# Patient Record
Sex: Female | Born: 1955
Health system: Southern US, Community
[De-identification: ages and names within clinical notes are randomized; demographics above are authoritative.]

## PROBLEM LIST (undated history)

## (undated) DIAGNOSIS — S060X9A Concussion with loss of consciousness of unspecified duration, initial encounter: Secondary | ICD-10-CM

## (undated) DIAGNOSIS — N952 Postmenopausal atrophic vaginitis: Secondary | ICD-10-CM

## (undated) DIAGNOSIS — Z9889 Other specified postprocedural states: Secondary | ICD-10-CM

## (undated) DIAGNOSIS — T4145XA Adverse effect of unspecified anesthetic, initial encounter: Secondary | ICD-10-CM

## (undated) DIAGNOSIS — T8859XA Other complications of anesthesia, initial encounter: Secondary | ICD-10-CM

## (undated) DIAGNOSIS — G4700 Insomnia, unspecified: Secondary | ICD-10-CM

## (undated) DIAGNOSIS — IMO0002 Reserved for concepts with insufficient information to code with codable children: Secondary | ICD-10-CM

## (undated) DIAGNOSIS — R51 Headache: Secondary | ICD-10-CM

## (undated) DIAGNOSIS — R112 Nausea with vomiting, unspecified: Secondary | ICD-10-CM

## (undated) DIAGNOSIS — I10 Essential (primary) hypertension: Secondary | ICD-10-CM

## (undated) DIAGNOSIS — L409 Psoriasis, unspecified: Secondary | ICD-10-CM

## (undated) DIAGNOSIS — E7800 Pure hypercholesterolemia, unspecified: Secondary | ICD-10-CM

## (undated) DIAGNOSIS — G479 Sleep disorder, unspecified: Secondary | ICD-10-CM

## (undated) DIAGNOSIS — M810 Age-related osteoporosis without current pathological fracture: Secondary | ICD-10-CM

## (undated) DIAGNOSIS — Z973 Presence of spectacles and contact lenses: Secondary | ICD-10-CM

## (undated) DIAGNOSIS — N950 Postmenopausal bleeding: Secondary | ICD-10-CM

## (undated) DIAGNOSIS — R233 Spontaneous ecchymoses: Secondary | ICD-10-CM

## (undated) DIAGNOSIS — R238 Other skin changes: Secondary | ICD-10-CM

## (undated) DIAGNOSIS — Z8249 Family history of ischemic heart disease and other diseases of the circulatory system: Secondary | ICD-10-CM

## (undated) DIAGNOSIS — M1990 Unspecified osteoarthritis, unspecified site: Secondary | ICD-10-CM

## (undated) DIAGNOSIS — W19XXXA Unspecified fall, initial encounter: Secondary | ICD-10-CM

## (undated) DIAGNOSIS — Z85828 Personal history of other malignant neoplasm of skin: Secondary | ICD-10-CM

## (undated) DIAGNOSIS — R519 Headache, unspecified: Secondary | ICD-10-CM

## (undated) DIAGNOSIS — Z87440 Personal history of urinary (tract) infections: Secondary | ICD-10-CM

## (undated) DIAGNOSIS — F419 Anxiety disorder, unspecified: Secondary | ICD-10-CM

## (undated) DIAGNOSIS — L4050 Arthropathic psoriasis, unspecified: Secondary | ICD-10-CM

## (undated) DIAGNOSIS — M545 Low back pain, unspecified: Secondary | ICD-10-CM

## (undated) DIAGNOSIS — I517 Cardiomegaly: Secondary | ICD-10-CM

## (undated) DIAGNOSIS — S060XAA Concussion with loss of consciousness status unknown, initial encounter: Secondary | ICD-10-CM

## (undated) DIAGNOSIS — Z8709 Personal history of other diseases of the respiratory system: Secondary | ICD-10-CM

## (undated) HISTORY — DX: Family history of ischemic heart disease and other diseases of the circulatory system: Z82.49

## (undated) HISTORY — PX: HAND SURGERY: SHX662

## (undated) HISTORY — DX: Pure hypercholesterolemia, unspecified: E78.00

## (undated) HISTORY — DX: Insomnia, unspecified: G47.00

## (undated) HISTORY — DX: Headache, unspecified: R51.9

## (undated) HISTORY — DX: Postmenopausal bleeding: N95.0

## (undated) HISTORY — DX: Postmenopausal atrophic vaginitis: N95.2

## (undated) HISTORY — DX: Anxiety disorder, unspecified: F41.9

## (undated) HISTORY — DX: Low back pain, unspecified: M54.50

## (undated) HISTORY — DX: Headache: R51

---

## 2000-03-21 ENCOUNTER — Ambulatory Visit (HOSPITAL_BASED_OUTPATIENT_CLINIC_OR_DEPARTMENT_OTHER): Admission: RE | Admit: 2000-03-21 | Discharge: 2000-03-21 | Payer: Self-pay | Admitting: *Deleted

## 2000-06-13 ENCOUNTER — Ambulatory Visit (HOSPITAL_BASED_OUTPATIENT_CLINIC_OR_DEPARTMENT_OTHER): Admission: RE | Admit: 2000-06-13 | Discharge: 2000-06-13 | Payer: Self-pay | Admitting: *Deleted

## 2000-10-30 ENCOUNTER — Other Ambulatory Visit: Admission: RE | Admit: 2000-10-30 | Discharge: 2000-10-30 | Payer: Self-pay | Admitting: Obstetrics & Gynecology

## 2001-05-21 ENCOUNTER — Encounter (INDEPENDENT_AMBULATORY_CARE_PROVIDER_SITE_OTHER): Payer: Self-pay | Admitting: Specialist

## 2001-05-21 ENCOUNTER — Encounter: Payer: Self-pay | Admitting: Rheumatology

## 2001-05-21 ENCOUNTER — Ambulatory Visit (HOSPITAL_COMMUNITY): Admission: RE | Admit: 2001-05-21 | Discharge: 2001-05-21 | Payer: Self-pay | Admitting: Rheumatology

## 2001-10-31 ENCOUNTER — Other Ambulatory Visit: Admission: RE | Admit: 2001-10-31 | Discharge: 2001-10-31 | Payer: Self-pay | Admitting: Obstetrics & Gynecology

## 2002-01-14 ENCOUNTER — Encounter: Admission: RE | Admit: 2002-01-14 | Discharge: 2002-01-14 | Payer: Self-pay | Admitting: Rheumatology

## 2002-01-14 ENCOUNTER — Encounter: Payer: Self-pay | Admitting: Rheumatology

## 2002-11-26 ENCOUNTER — Other Ambulatory Visit: Admission: RE | Admit: 2002-11-26 | Discharge: 2002-11-26 | Payer: Self-pay | Admitting: Obstetrics & Gynecology

## 2003-09-04 ENCOUNTER — Encounter (INDEPENDENT_AMBULATORY_CARE_PROVIDER_SITE_OTHER): Payer: Self-pay | Admitting: Specialist

## 2003-09-04 ENCOUNTER — Ambulatory Visit (HOSPITAL_COMMUNITY): Admission: RE | Admit: 2003-09-04 | Discharge: 2003-09-04 | Payer: Self-pay | Admitting: Rheumatology

## 2004-01-04 ENCOUNTER — Other Ambulatory Visit: Admission: RE | Admit: 2004-01-04 | Discharge: 2004-01-04 | Payer: Self-pay | Admitting: Obstetrics & Gynecology

## 2004-10-16 HISTORY — PX: BACK SURGERY: SHX140

## 2005-02-02 ENCOUNTER — Other Ambulatory Visit: Admission: RE | Admit: 2005-02-02 | Discharge: 2005-02-02 | Payer: Self-pay | Admitting: Obstetrics & Gynecology

## 2005-06-10 ENCOUNTER — Encounter: Admission: RE | Admit: 2005-06-10 | Discharge: 2005-06-10 | Payer: Self-pay | Admitting: Neurological Surgery

## 2005-06-29 ENCOUNTER — Inpatient Hospital Stay (HOSPITAL_COMMUNITY): Admission: RE | Admit: 2005-06-29 | Discharge: 2005-06-30 | Payer: Self-pay | Admitting: Neurological Surgery

## 2005-08-08 ENCOUNTER — Encounter: Admission: RE | Admit: 2005-08-08 | Discharge: 2005-08-08 | Payer: Self-pay | Admitting: Neurological Surgery

## 2005-09-25 ENCOUNTER — Encounter: Admission: RE | Admit: 2005-09-25 | Discharge: 2005-09-25 | Payer: Self-pay | Admitting: Neurological Surgery

## 2005-10-24 ENCOUNTER — Encounter: Admission: RE | Admit: 2005-10-24 | Discharge: 2005-10-24 | Payer: Self-pay | Admitting: Neurological Surgery

## 2006-03-26 ENCOUNTER — Encounter: Admission: RE | Admit: 2006-03-26 | Discharge: 2006-03-26 | Payer: Self-pay | Admitting: Neurological Surgery

## 2006-03-29 ENCOUNTER — Encounter: Admission: RE | Admit: 2006-03-29 | Discharge: 2006-03-29 | Payer: Self-pay | Admitting: Neurological Surgery

## 2007-01-14 ENCOUNTER — Encounter: Admission: RE | Admit: 2007-01-14 | Discharge: 2007-01-14 | Payer: Self-pay | Admitting: Anesthesiology

## 2007-09-16 ENCOUNTER — Encounter: Admission: RE | Admit: 2007-09-16 | Discharge: 2007-09-16 | Payer: Self-pay | Admitting: Neurological Surgery

## 2007-09-24 ENCOUNTER — Encounter: Admission: RE | Admit: 2007-09-24 | Discharge: 2007-09-24 | Payer: Self-pay | Admitting: Neurological Surgery

## 2007-12-07 ENCOUNTER — Encounter: Admission: RE | Admit: 2007-12-07 | Discharge: 2007-12-07 | Payer: Self-pay | Admitting: Neurological Surgery

## 2008-05-25 ENCOUNTER — Encounter: Admission: RE | Admit: 2008-05-25 | Discharge: 2008-05-25 | Payer: Self-pay | Admitting: Rheumatology

## 2009-03-01 ENCOUNTER — Encounter: Admission: RE | Admit: 2009-03-01 | Discharge: 2009-03-01 | Payer: Self-pay | Admitting: Rheumatology

## 2010-05-13 ENCOUNTER — Encounter: Admission: RE | Admit: 2010-05-13 | Discharge: 2010-05-13 | Payer: Self-pay | Admitting: Neurological Surgery

## 2010-11-06 ENCOUNTER — Encounter: Payer: Self-pay | Admitting: Neurological Surgery

## 2010-12-27 ENCOUNTER — Other Ambulatory Visit: Payer: Self-pay | Admitting: Obstetrics & Gynecology

## 2011-03-03 NOTE — Op Note (Signed)
[place]  Patient:    [name], [name]                      MRN: [id number] Proc. Date: 06/13/00 Adm. Date:  [id number] Attending:  Kendell Bane CC:         Demetria Pore. Coral Spikes, M.D.                           Operative Report  PREOPERATIVE DIAGNOSIS:  Fibrous union of proximal interphalangeal arthrodesis, right index finger.  POSTOPERATIVE DIAGNOSIS:  Fibrous union of proximal interphalangeal arthrodesis, right index finger.  OPERATION PERFORMED:  Excision of fibrous tissue with arthrodesis of proximal interphalangeal joint, right index finger.  SURGEON:  Lowell Bouton, M.D.  ANESTHESIA:  Axillary block augmented by general.  OPERATIVE FINDINGS:  The patient had fibrosis over the ends of the bone, both on the proximal phalanx and middle phalanx side.  The fibrous tissue was preventing bony union.  DESCRIPTION OF PROCEDURE:  Under general anesthesia with a tourniquet on the right arm, the right hand was prepped and draped in the usual fashion.  After exsanguinating the limb, the tourniquet was inflated to  250 mmHg.  A longitudinal incision was made in line with the old scar on the dorsum of the right index finger proximal interphalangeal joint.  Sharp dissection was carried through the extensor mechanism which was elevated off of the bone radially and ulnarly.  The proximal interphalangeal joint was flexed and fibrous tissue was removed with a rongeur.  The ends of the bone were then fractured up with a saw and saline irrigation was used to cool the saw blade. After freshening up the end, the PIP joint was approximated and was found to be appropriately at about 30 degrees of flexion.  X-rays showed good position of the proposed fusion.  A drill hole was made with a 45 K-wire in the middle phalanx through the dorsal cortex radially and ulnarly for passage of the 26 gauge wire.  A 26 gauge wire was passed through the  hole distally for tension band wiring technique.  A free needle was used to pass the wire.  Two 45 K-wires were then placed across the PIP joint from proximal to distal.  The joint was compressed for arthrodesis while passing the wires.  Clinically, there was no rotational deformity.  The position looked good under x-ray.  The fusion surfaces were approximated well.  The figure-of-eight tension band wire was then passed around the K-wires proximally and was tightened with a needle holder.  The edge of the wire was buried underneath the tendon.  The K-wires were then bent over and were impacted into the bone.  The wound was irrigated copiously with saline. There did not appear to be room to apply any type of bone graft and so grafting was not performed.  The extensor mechanism was closed with 4-0 Mersilene and the skin with a 4-0 subcuticular Prolene. Steri-Strips were applied followed by sterile  dressings and a dorsal protective splint.  The patient tolerated the procedure well and went to the recovery room awake and stable in good condition. DD:  06/13/00 TD:  06/14/00 Job: [id number]

## 2011-03-03 NOTE — Op Note (Signed)
[name], [name]             ACCOUNT NO.:  [id number]   MEDICAL RECORD NO.:  [id number]          PATIENT TYPE:  INP   LOCATION:  2869                         FACILITY:  [facility]   PHYSICIAN:  Tia Alert, MD     DATE OF BIRTH:  1955/11/14   DATE OF PROCEDURE:  DATE OF DISCHARGE:                                 OPERATIVE REPORT   PREOPERATIVE DIAGNOSIS:  Cervical spondylosis with cervical spinal stenosis  and subaxial subluxation secondary to rheumatologic disease.   POSTOPERATIVE DIAGNOSIS:  Cervical spondylosis with cervical spinal stenosis  and subaxial subluxation secondary to rheumatologic disease.   PROCEDURES:  1.  Decompressive cervical laminectomy C3 to C6 with foraminotomies at C4-5      bilaterally.  2.  Posterior cervical arthrodesis C3 to C7 inclusive utilizing locally      harvested morselized autologous bone graft and BMP soaked sponges.  3.  Segmental fixation C3 to C7 utilizing the vertex lateral mass screw      fixation system.   SURGEON:  Marikay Alar M.D.   ASSISTANT:  Dr. Aliene Beams.   ANESTHESIA:  General endotracheal.   COMPLICATIONS:  None apparent.   INDICATIONS FOR PROCEDURE:  [name] is a 55 year old female with a  history of psoriatic arthritis with multiple joint abnormalities.  She  presented with neck pain and had plain films which showed subaxial  subluxation of C4 on C5, C5 on C6.  She had an MRI which showed severe  spinal stenosis at C4-5. I recommended a posterior cervical decompression  and instrumented fusion from C3 to C7. She understood the risks, benefits  and alternatives and wished to proceed.   DESCRIPTION OF PROCEDURE:  The patient was taken to operating room and after  induction of adequate generalized endotracheal anesthesia, her head was  placed a three-point Mayfield headrest and she was rolled into prone  position on chest rolls and her head was fixed in a neutral position. Her  posterior cervical region was  prepped with DuraPrep and draping in the usual  sterile fashion. A dorsal midline incision was made and carried down to the  cervical fascia which was opened and the paraspinous musculature was taken  down in subperiosteal fashion to expose C3 to C7 bilaterally. Intraoperative  fluoroscopy confirmed my level. She had a lot of degenerative changes and  dysplastic changes at C4 and C5 with diastasis of the facettes at C4-5. She  had severe subluxation preoperatively but upon positioning was reduced the  C4-5, suggesting significant instability at this level.  I localized the  lateral mass mid points at C3, C5, C6 and C7, decorticated 1 mm medial to  the mid point of lateral mass and then drilled in an upward and outward  direction under fluoroscopic guidance into the safe zone of the lateral  mass.  I then the tapped each one and then used 12 mm vertex screws and  placed these into the lateral masses at C3, C5, C6 and C7.  At C4, I tried  to start drilling into the safe zone, however, this was very difficult  because of the  dysplastic lamina.  It was quite thin and I was unable to  achieve good purchase into the bone. Therefore I left lateral mass screws  out of C4.  I then used the Kerrison punch to perform laminectomies at C3,  C4, C5 and C6 and the top of C7 and perform foraminotomies at C4-5.  There  was quite a bit of wasting of the dura at C4-5 with stenosis and then the  dura was pulsatile and well decompressed.  I then irrigated with copious  amounts of bacitracin containing saline solution and lined the dura with  Surgifoam and then shaped two lordotic rods and placed these into the  multiaxial screw heads of the vertex screws and locked these in position  with the locking caps and antitorque device and placed a cross link for  added stability.  I then drilled the lateral masses, I had already drilled  into the facette joint and I was able to packed this with local autograft  and BMP  soaked sponges lateral to the vertex screws.  I then placed a medium  Hemovac drain through a separate stab incision, layered the dura with  Gelfoam, and then closed the fascia with interrupted #1 Vicryl, closed the  subcutaneous and subcuticular tissues with 2-0 and 3-0 Vicryl, closed the  skin with Benzoin and Steri-Strips. The drapes were removed. The patient was  taken out of three-point Mayfield headrest, awakened from general anesthesia  and transferred to recovery room in stable condition.  At the end of the  procedure all sponge, needle and instrument counts were correct.      Tia Alert, MD  Electronically Signed     DSJ/MEDQ  D:  06/29/2005  T:  06/29/2005  Job:  [id number]

## 2011-03-03 NOTE — Op Note (Signed)
[place]  Patient:    [name], [name]                      MRN: [id number] Proc. Date: 03/21/00 Adm. Date:  [id number] Disc. Date: [id number] Attending:  Kendell Bane CC:         Demetria Pore. Coral Spikes, M.D.                           Operative Report  PREOPERATIVE DIAGNOSIS:  Psoriatic arthritis with instability and pain in right index finger.  POSTOPERATIVE DIAGNOSIS:  Psoriatic arthritis with instability and pain in right index finger.  PROCEDURE:  Arthrodesis posterior interphalangeal joint and distal interphalangeal joint, right index finger.  SURGEON:  Lowell Bouton, M.D.  ASSISTANT:  ANESTHESIA:  Axillary block.  OPERATIVE FINDINGS:  The patient had volar subluxation of the PIP joint with complete destruction of the articular surface on the proximal phalanx.  The end of the bone was pointed instead of round at the PIP joint.  The DIP joint had synovitis and destruction with total instability, however, the articular surfaces were not completely destroyed.  DESCRIPTION OF PROCEDURE:  Under axillary block anesthesia with the tourniquet on the right arm, the right hand was prepped and draped in the usual fashion and the patient was given IV Ancef preoperatively.  The limb was exsanguinated and the tourniquet was inflated to 250 mmHg.  A longitudinal incision was made in the midline over the dorsum of the PIP joint.  The extensor tendon was divided longitudinally and the tendon was elevated from its insertion on the middle phalanx radially and ulnarly.  There was a fair amount of synovitis at the PIP level and this was debrided with the rongeur.  The collateral ligaments were cut radially and ulnarly and the finger was flexed to expose the articular surface.  A rongeur was used to round off the pointed bone on the proximal phalanx.  The proximal end of the middle phalanx was prepared by using a 35 K-wire and making  drill holes circumferentially around the articular surface and an osteotome was used to remove the cartilage down to subchondral bone.  This formed a typical cup and cone arthrodesis.  Two 35 K-wires were then passed from the joint across the middle phalanx and were then passed retrograde back into the proximal phalanx with the joint in about 30 degrees of flexion.  Alignment looked good and the K-wires were bent over and left protruding from the skin.  At this point, the incision was extended down to the DIP joint and then a Y-shaped limb was placed distally. Dissection was carried down through the extensor tendon and a transverse arthrotomy was made at the DIP joint.  Synovial tissue was removed with the rongeur and the collateral ligaments were divided with a knife.  The joint surfaces were prepared similar to the PIP joint with the rongeur proximally making a round surface on the middle phalanx and K-wires and an osteotome making a cup surface on the distal phalanx.  A 35 K-wire was then placed longitudinally out through the distal phalanx, extending from the tip of the digit, and a second K-wire obliquely extending through the distal phalanx. The longitudinal K-wire was then passed retrograde across the DIP joint with the DIP joint in about 5 degrees of flexion.  The second K-wire was placed obliquely and x-rays showed good position of both joints.  Clinically  the patients finger appeared straighter and was kept in slight supination to allow for improvement and pinch.  The K-wires were bent over and left protruding from the skin.  The wounds were irrigated copiously with saline. The extensor mechanism was repaired with a 4-0 Vicryl and the skin with a 4-0 nylon.  Sterile dressings were applied.  The tourniquet was released with good circulation to the digit.  The patient was placed in a bulky bandage on the index finger using an Alumaform splint to protect the digit.  The remaining  digits were left free for her to use.  She tolerated the procedure well and went to the recovery room, awake, stable, and in good condition. DD:  03/21/00 TD:  03/24/00 Job: [id number]

## 2013-03-27 ENCOUNTER — Other Ambulatory Visit: Payer: Self-pay | Admitting: Dermatology

## 2013-06-03 ENCOUNTER — Other Ambulatory Visit: Payer: Self-pay | Admitting: Obstetrics & Gynecology

## 2013-10-23 ENCOUNTER — Encounter (HOSPITAL_COMMUNITY): Payer: Self-pay | Admitting: Pharmacy Technician

## 2013-10-24 ENCOUNTER — Other Ambulatory Visit (HOSPITAL_COMMUNITY): Payer: Self-pay | Admitting: *Deleted

## 2013-10-24 NOTE — Patient Instructions (Addendum)
THANDIWE SIRAGUSA  10/24/2013                           YOUR PROCEDURE IS SCHEDULED ON: 11/03/13               PLEASE REPORT TO SHORT STAY CENTER AT :  7:30 AM               CALL THIS NUMBER IF ANY PROBLEMS THE DAY OF SURGERY :               [phone]                      REMEMBER:   Do not eat food or drink liquids AFTER MIDNIGHT   Take these medicines the morning of surgery with A SIP OF WATER: PREDNISONE / MAY TAKE TYLENOL IF NEEDED   Do not wear jewelry, make-up   Do not wear lotions, powders, or perfumes.   Do not shave legs or underarms 12 hrs. before surgery (men may shave face)  Do not bring valuables to the hospital.  Contacts, dentures or bridgework may not be worn into surgery.  Leave suitcase in the car. After surgery it may be brought to your room.  For patients admitted to the hospital more than one night, checkout time is 11:00                          The day of discharge.   Patients discharged the day of surgery will not be allowed to drive home                             If going home same day of surgery, must have someone stay with you first                           24 hrs at home and arrange for some one to drive you home from hospital.    Special Instructions:   Please read over the following fact sheets that you were given:               1. MRSA  INFORMATION                      2. [place] PREPARING FOR SURGERY SHEET               3. INCENTIVE SPIROMETER                                                X_____________________________________________________________________        Failure to follow these instructions may result in cancellation of your surgery

## 2013-10-28 ENCOUNTER — Encounter (HOSPITAL_COMMUNITY)
Admission: RE | Admit: 2013-10-28 | Discharge: 2013-10-28 | Disposition: A | Discharge status: Home / Self Care | Payer: BC Managed Care – PPO | Source: Ambulatory Visit | Attending: Orthopedic Surgery | Admitting: Orthopedic Surgery

## 2013-10-28 ENCOUNTER — Other Ambulatory Visit: Payer: Self-pay

## 2013-10-28 ENCOUNTER — Encounter (HOSPITAL_COMMUNITY): Payer: Self-pay

## 2013-10-28 ENCOUNTER — Ambulatory Visit (HOSPITAL_COMMUNITY)
Admission: RE | Admit: 2013-10-28 | Discharge: 2013-10-28 | Disposition: A | Discharge status: Home / Self Care | Payer: BC Managed Care – PPO | Source: Ambulatory Visit | Attending: Orthopedic Surgery | Admitting: Orthopedic Surgery

## 2013-10-28 DIAGNOSIS — Z0183 Encounter for blood typing: Secondary | ICD-10-CM | POA: Insufficient documentation

## 2013-10-28 DIAGNOSIS — Z01818 Encounter for other preprocedural examination: Secondary | ICD-10-CM | POA: Insufficient documentation

## 2013-10-28 DIAGNOSIS — I10 Essential (primary) hypertension: Secondary | ICD-10-CM | POA: Insufficient documentation

## 2013-10-28 DIAGNOSIS — Z01812 Encounter for preprocedural laboratory examination: Secondary | ICD-10-CM | POA: Insufficient documentation

## 2013-10-28 DIAGNOSIS — Z01810 Encounter for preprocedural cardiovascular examination: Secondary | ICD-10-CM | POA: Insufficient documentation

## 2013-10-28 DIAGNOSIS — M1710 Unilateral primary osteoarthritis, unspecified knee: Secondary | ICD-10-CM | POA: Insufficient documentation

## 2013-10-28 HISTORY — DX: Essential (primary) hypertension: I10

## 2013-10-28 HISTORY — DX: Arthropathic psoriasis, unspecified: L40.50

## 2013-10-28 HISTORY — DX: Sleep disorder, unspecified: G47.9

## 2013-10-28 HISTORY — DX: Unspecified osteoarthritis, unspecified site: M19.90

## 2013-10-28 HISTORY — DX: Other skin changes: R23.8

## 2013-10-28 HISTORY — DX: Other specified postprocedural states: Z98.890

## 2013-10-28 HISTORY — DX: Other complications of anesthesia, initial encounter: T88.59XA

## 2013-10-28 HISTORY — DX: Adverse effect of unspecified anesthetic, initial encounter: T41.45XA

## 2013-10-28 HISTORY — DX: Age-related osteoporosis without current pathological fracture: M81.0

## 2013-10-28 HISTORY — DX: Psoriasis, unspecified: L40.9

## 2013-10-28 HISTORY — DX: Spontaneous ecchymoses: R23.3

## 2013-10-28 HISTORY — DX: Personal history of other malignant neoplasm of skin: Z85.828

## 2013-10-28 HISTORY — DX: Nausea with vomiting, unspecified: R11.2

## 2013-10-28 LAB — BASIC METABOLIC PANEL
BUN: 17 mg/dL (ref 6–23)
CO2: 22 meq/L (ref 19–32)
CREATININE: 0.78 mg/dL (ref 0.50–1.10)
Calcium: 9.4 mg/dL (ref 8.4–10.5)
Chloride: 101 mEq/L (ref 96–112)
GFR calc Af Amer: >90 mL/min (ref >90)
Glucose, Bld: 115 mg/dL — ABNORMAL HIGH (ref 70–99)
Potassium: 4.5 mEq/L (ref 3.7–5.3)
Sodium: 136 mEq/L — ABNORMAL LOW (ref 137–147)

## 2013-10-28 LAB — CBC
HCT: 40.9 % (ref 36.0–46.0)
Hemoglobin: 13.2 g/dL (ref 12.0–15.0)
MCH: 29.3 pg (ref 26.0–34.0)
MCHC: 32.3 g/dL (ref 30.0–36.0)
MCV: 90.7 fL (ref 78.0–100.0)
PLATELETS: 350 10*3/uL (ref 150–400)
RBC: 4.51 MIL/uL (ref 3.87–5.11)
RDW: 13.6 % (ref 11.5–15.5)
WBC: 9.1 10*3/uL (ref 4.0–10.5)

## 2013-10-28 LAB — PROTIME-INR
INR: 0.94 (ref 0.00–1.49)
Prothrombin Time: 12.4 seconds (ref 11.6–15.2)

## 2013-10-28 LAB — URINALYSIS, ROUTINE W REFLEX MICROSCOPIC
Bilirubin Urine: NEGATIVE
Glucose, UA: NEGATIVE mg/dL
Ketones, ur: NEGATIVE mg/dL
Nitrite: NEGATIVE
Protein, ur: NEGATIVE mg/dL
Specific Gravity, Urine: 1.016 (ref 1.005–1.030)
Urobilinogen, UA: 0.2 mg/dL (ref 0.0–1.0)
pH: 6 (ref 5.0–8.0)

## 2013-10-28 LAB — URINE MICROSCOPIC-ADD ON

## 2013-10-28 LAB — APTT: aPTT: 29 seconds (ref 24–37)

## 2013-10-28 LAB — SURGICAL PCR SCREEN
MRSA, PCR: NEGATIVE
STAPHYLOCOCCUS AUREUS: NEGATIVE

## 2013-10-28 LAB — ABO/RH: ABO/RH(D): O POS

## 2013-10-28 NOTE — Progress Notes (Signed)
UA faxed to Dr. Olin 

## 2013-10-29 NOTE — H&P (Signed)
TOTAL KNEE ADMISSION H&P  Patient is being admitted for right total knee arthroplasty.  Subjective:  Chief Complaint:     Right knee OA / pain.  HPI: [name], 58 y.o. female, has a history of pain and functional disability in the right knee due to arthritis and has failed non-surgical conservative treatments for greater than 12 weeks to includeNSAID's and/or analgesics, corticosteriod injections, viscosupplementation injections and activity modification.  Onset of symptoms was gradual, starting 2+ years ago with gradually worsening course since that time. The patient noted no past surgery on the right knee(s).  Patient currently rates pain in the right knee(s) at 8 out of 10 with activity. Patient has night pain, worsening of pain with activity and weight bearing, pain that interferes with activities of daily living, pain with passive range of motion, crepitus and joint swelling.  Patient has evidence of periarticular osteophytes and joint space narrowing by imaging studies.  There is no active infection.  Risks, benefits and expectations were discussed with the patient.  Risks including but not limited to the risk of anesthesia, blood clots, nerve damage, blood vessel damage, failure of the prosthesis, infection and up to and including death.  Patient understand the risks, benefits and expectations and wishes to proceed with surgery.   D/C Plans:   SNF ([facility])  Post-op Meds:    No Rx given  Tranexamic Acid:   To be given  Decadron:    To be given  FYI:    ASA post-op  Norco post-op    Past Medical History  Diagnosis Date  . Complication of anesthesia   . PONV (postoperative nausea and vomiting)   . Hypertension   . Arthritis   . Arthritis with psoriasis   . Osteoporosis   . Psoriasis   . Bruises easily   . Difficulty sleeping   . History of nonmelanoma skin cancer     Past Surgical History  Procedure Laterality Date  . Back surgery  2006    fusion C2-C6   . Hand  surgery      multiple surgeries due to arthritis  . Cesarean section      No prescriptions prior to admission   Allergies  Allergen Reactions  . Other     clineril caused hepatitis b     History  Substance Use Topics  . Smoking status: Never Smoker   . Smokeless tobacco: Not on file  . Alcohol Use: No     Comment: occasional    No family history on file.   Review of Systems  Constitutional: Positive for malaise/fatigue.  HENT: Negative.   Eyes: Negative.   Respiratory: Negative.   Cardiovascular: Negative.   Gastrointestinal: Negative.   Genitourinary: Positive for frequency.  Musculoskeletal: Positive for joint pain.  Skin: Positive for itching and rash.  Neurological: Negative.   Endo/Heme/Allergies: Negative.   Psychiatric/Behavioral: Negative.     Objective:  Physical Exam  Constitutional: She is oriented to person, place, and time. She appears well-developed and well-nourished.  HENT:  Head: Normocephalic and atraumatic.  Mouth/Throat: Oropharynx is clear and moist.  Eyes: Pupils are equal, round, and reactive to light.  Neck: Neck supple. No JVD present. No tracheal deviation present. No thyromegaly present.  Cardiovascular: Normal rate, regular rhythm, normal heart sounds and intact distal pulses.   Respiratory: Effort normal and breath sounds normal. No stridor. No respiratory distress. She has no wheezes.  GI: Soft. There is no tenderness. There is no guarding.  Musculoskeletal:  Right knee: She exhibits decreased range of motion, swelling and bony tenderness. She exhibits no effusion, no ecchymosis, no deformity, no laceration and no erythema. Tenderness found.  Lymphadenopathy:    She has no cervical adenopathy.  Neurological: She is alert and oriented to person, place, and time.  Skin: Skin is warm and dry.  Psychiatric: She has a normal mood and affect.     Imaging Review Plain radiographs demonstrate severe degenerative joint disease of the  right knee(s). The overall alignment isneutral. The bone quality appears to be good for age and reported activity level.  Assessment/Plan:  End stage arthritis, right knee   The patient history, physical examination, clinical judgment of the provider and imaging studies are consistent with end stage degenerative joint disease of the right knee(s) and total knee arthroplasty is deemed medically necessary. The treatment options including medical management, injection therapy arthroscopy and arthroplasty were discussed at length. The risks and benefits of total knee arthroplasty were presented and reviewed. The risks due to aseptic loosening, infection, stiffness, patella tracking problems, thromboembolic complications and other imponderables were discussed. The patient acknowledged the explanation, agreed to proceed with the plan and consent was signed. Patient is being admitted for inpatient treatment for surgery, pain control, PT, OT, prophylactic antibiotics, VTE prophylaxis, progressive ambulation and ADL's and discharge planning. The patient is planning to be discharged to skilled nursing facility.     Anastasio Auerbach Dewel Lotter   PAC  10/29/2013, 4:18 PM

## 2013-10-30 LAB — URINE CULTURE: Colony Count: >=100000

## 2013-11-02 MED ORDER — TRANEXAMIC ACID 100 MG/ML IV SOLN
1000.0000 mg | Freq: Once | INTRAVENOUS | Status: AC
  Administered 2013-11-03: 1000 mg via INTRAVENOUS
  Filled 2013-11-02: qty 10

## 2013-11-03 ENCOUNTER — Encounter (HOSPITAL_COMMUNITY): Payer: BC Managed Care – PPO | Admitting: Registered Nurse

## 2013-11-03 ENCOUNTER — Inpatient Hospital Stay (HOSPITAL_COMMUNITY)
Admission: RE | Admit: 2013-11-03 | Discharge: 2013-11-05 | DRG: 470 | Disposition: A | Discharge status: Skilled Nursing Facility | Payer: BC Managed Care – PPO | Source: Ambulatory Visit | Attending: Orthopedic Surgery | Admitting: Orthopedic Surgery

## 2013-11-03 ENCOUNTER — Encounter (HOSPITAL_COMMUNITY)
Admission: RE | Disposition: A | Discharge status: Skilled Nursing Facility | Payer: Self-pay | Source: Ambulatory Visit | Attending: Orthopedic Surgery

## 2013-11-03 ENCOUNTER — Encounter (HOSPITAL_COMMUNITY): Payer: Self-pay | Admitting: Registered Nurse

## 2013-11-03 ENCOUNTER — Inpatient Hospital Stay (HOSPITAL_COMMUNITY): Payer: BC Managed Care – PPO | Admitting: Registered Nurse

## 2013-11-03 DIAGNOSIS — M069 Rheumatoid arthritis, unspecified: Secondary | ICD-10-CM | POA: Diagnosis present

## 2013-11-03 DIAGNOSIS — Z85828 Personal history of other malignant neoplasm of skin: Secondary | ICD-10-CM

## 2013-11-03 DIAGNOSIS — Z888 Allergy status to other drugs, medicaments and biological substances status: Secondary | ICD-10-CM

## 2013-11-03 DIAGNOSIS — M659 Unspecified synovitis and tenosynovitis, unspecified site: Secondary | ICD-10-CM | POA: Diagnosis present

## 2013-11-03 DIAGNOSIS — M1710 Unilateral primary osteoarthritis, unspecified knee: Principal | ICD-10-CM | POA: Diagnosis present

## 2013-11-03 DIAGNOSIS — Z96659 Presence of unspecified artificial knee joint: Secondary | ICD-10-CM

## 2013-11-03 DIAGNOSIS — M810 Age-related osteoporosis without current pathological fracture: Secondary | ICD-10-CM | POA: Diagnosis present

## 2013-11-03 DIAGNOSIS — D62 Acute posthemorrhagic anemia: Secondary | ICD-10-CM | POA: Diagnosis not present

## 2013-11-03 DIAGNOSIS — I10 Essential (primary) hypertension: Secondary | ICD-10-CM | POA: Diagnosis present

## 2013-11-03 HISTORY — PX: TOTAL KNEE ARTHROPLASTY: SHX125

## 2013-11-03 LAB — TYPE AND SCREEN
ABO/RH(D): O POS
Antibody Screen: NEGATIVE

## 2013-11-03 SURGERY — ARTHROPLASTY, KNEE, TOTAL
Anesthesia: Spinal | Site: Knee | Laterality: Right

## 2013-11-03 MED ORDER — PHENYLEPHRINE HCL 10 MG/ML IJ SOLN
INTRAMUSCULAR | Status: DC | PRN
  Administered 2013-11-03: 80 ug via INTRAVENOUS

## 2013-11-03 MED ORDER — FENTANYL CITRATE 0.05 MG/ML IJ SOLN
INTRAMUSCULAR | Status: DC | PRN
Start: 2013-11-03 — End: 2013-11-03
  Administered 2013-11-03: 100 ug via INTRAVENOUS

## 2013-11-03 MED ORDER — PHENYLEPHRINE 40 MCG/ML (10ML) SYRINGE FOR IV PUSH (FOR BLOOD PRESSURE SUPPORT)
PREFILLED_SYRINGE | INTRAVENOUS | Status: AC
  Filled 2013-11-03: qty 10

## 2013-11-03 MED ORDER — PROPOFOL 10 MG/ML IV BOLUS
INTRAVENOUS | Status: DC | PRN
  Administered 2013-11-03: 40 mg via INTRAVENOUS

## 2013-11-03 MED ORDER — SODIUM CHLORIDE 0.9 % IV SOLN
INTRAVENOUS | Status: DC
  Administered 2013-11-03 – 2013-11-04 (×2): via INTRAVENOUS
  Filled 2013-11-03 (×7): qty 1000

## 2013-11-03 MED ORDER — PHENYLEPHRINE HCL 10 MG/ML IJ SOLN
INTRAMUSCULAR | Status: AC
  Filled 2013-11-03: qty 1

## 2013-11-03 MED ORDER — ONDANSETRON HCL 4 MG/2ML IJ SOLN: 4.0000 mg | Freq: Four times a day (QID) | INTRAMUSCULAR | Status: DC | PRN

## 2013-11-03 MED ORDER — ONDANSETRON HCL 4 MG PO TABS: 4.0000 mg | ORAL_TABLET | Freq: Four times a day (QID) | ORAL | Status: DC | PRN

## 2013-11-03 MED ORDER — 0.9 % SODIUM CHLORIDE (POUR BTL) OPTIME
TOPICAL | Status: DC | PRN
  Administered 2013-11-03: 1000 mL

## 2013-11-03 MED ORDER — METHOCARBAMOL 500 MG PO TABS
500.0000 mg | ORAL_TABLET | Freq: Four times a day (QID) | ORAL | Status: DC | PRN
  Administered 2013-11-03 – 2013-11-05 (×6): 500 mg via ORAL
  Filled 2013-11-03 (×6): qty 1

## 2013-11-03 MED ORDER — PHENOL 1.4 % MT LIQD: 1.0000 | OROMUCOSAL | Status: DC | PRN

## 2013-11-03 MED ORDER — SODIUM CHLORIDE 0.9 % IJ SOLN
INTRAMUSCULAR | Status: DC | PRN
  Administered 2013-11-03: 11:00:00

## 2013-11-03 MED ORDER — PHENYLEPHRINE HCL 10 MG/ML IJ SOLN
10.0000 mg | INTRAVENOUS | Status: DC | PRN
  Administered 2013-11-03: 10 ug/min via INTRAVENOUS

## 2013-11-03 MED ORDER — ONDANSETRON HCL 4 MG/2ML IJ SOLN
INTRAMUSCULAR | Status: DC | PRN
  Administered 2013-11-03: 4 mg via INTRAVENOUS

## 2013-11-03 MED ORDER — SODIUM CHLORIDE 0.9 % IR SOLN
Status: DC | PRN
  Administered 2013-11-03: 1000 mL

## 2013-11-03 MED ORDER — CEFAZOLIN SODIUM-DEXTROSE 2-3 GM-% IV SOLR
2.0000 g | Freq: Four times a day (QID) | INTRAVENOUS | Status: AC
  Administered 2013-11-03 (×2): 2 g via INTRAVENOUS
  Filled 2013-11-03 (×2): qty 50

## 2013-11-03 MED ORDER — DOCUSATE SODIUM 100 MG PO CAPS
100.0000 mg | ORAL_CAPSULE | Freq: Two times a day (BID) | ORAL | Status: DC
  Administered 2013-11-03 – 2013-11-05 (×4): 100 mg via ORAL

## 2013-11-03 MED ORDER — HYDROCODONE-ACETAMINOPHEN 7.5-325 MG PO TABS
1.0000 | ORAL_TABLET | ORAL | Status: DC
  Administered 2013-11-03 – 2013-11-05 (×11): 2 via ORAL
  Filled 2013-11-03 (×10): qty 2

## 2013-11-03 MED ORDER — ASPIRIN EC 325 MG PO TBEC
325.0000 mg | DELAYED_RELEASE_TABLET | Freq: Two times a day (BID) | ORAL | Status: DC
  Administered 2013-11-04 – 2013-11-05 (×3): 325 mg via ORAL
  Filled 2013-11-03 (×5): qty 1

## 2013-11-03 MED ORDER — HYDROMORPHONE HCL PF 1 MG/ML IJ SOLN
0.5000 mg | INTRAMUSCULAR | Status: DC | PRN
  Administered 2013-11-03 (×3): 1 mg via INTRAVENOUS
  Administered 2013-11-04: 0.5 mg via INTRAVENOUS
  Administered 2013-11-04 (×2): 2 mg via INTRAVENOUS
  Filled 2013-11-03: qty 1
  Filled 2013-11-03 (×2): qty 2
  Filled 2013-11-03 (×3): qty 1

## 2013-11-03 MED ORDER — CEFAZOLIN SODIUM-DEXTROSE 2-3 GM-% IV SOLR
2.0000 g | INTRAVENOUS | Status: AC
  Administered 2013-11-03: 2 g via INTRAVENOUS

## 2013-11-03 MED ORDER — METOCLOPRAMIDE HCL 5 MG/ML IJ SOLN: 5.0000 mg | Freq: Three times a day (TID) | INTRAMUSCULAR | Status: DC | PRN

## 2013-11-03 MED ORDER — LACTATED RINGERS IV SOLN
INTRAVENOUS | Status: DC
  Administered 2013-11-03: 1000 mL via INTRAVENOUS
  Administered 2013-11-03 (×2): via INTRAVENOUS

## 2013-11-03 MED ORDER — KETOROLAC TROMETHAMINE 30 MG/ML IJ SOLN
INTRAMUSCULAR | Status: DC | PRN
  Administered 2013-11-03: 30 mg

## 2013-11-03 MED ORDER — PROMETHAZINE HCL 25 MG/ML IJ SOLN: 6.2500 mg | INTRAMUSCULAR | Status: DC | PRN

## 2013-11-03 MED ORDER — MEPERIDINE HCL 50 MG/ML IJ SOLN: 6.2500 mg | INTRAMUSCULAR | Status: DC | PRN

## 2013-11-03 MED ORDER — METHOCARBAMOL 100 MG/ML IJ SOLN
500.0000 mg | Freq: Four times a day (QID) | INTRAVENOUS | Status: DC | PRN
  Filled 2013-11-03: qty 5

## 2013-11-03 MED ORDER — MIDAZOLAM HCL 2 MG/2ML IJ SOLN
INTRAMUSCULAR | Status: AC
  Filled 2013-11-03: qty 2

## 2013-11-03 MED ORDER — HYDROMORPHONE HCL PF 1 MG/ML IJ SOLN: 0.2500 mg | INTRAMUSCULAR | Status: DC | PRN

## 2013-11-03 MED ORDER — LEFLUNOMIDE 20 MG PO TABS
20.0000 mg | ORAL_TABLET | Freq: Every day | ORAL | Status: DC
  Administered 2013-11-04 – 2013-11-05 (×2): 20 mg via ORAL
  Filled 2013-11-03 (×3): qty 1

## 2013-11-03 MED ORDER — FENTANYL CITRATE 0.05 MG/ML IJ SOLN
INTRAMUSCULAR | Status: AC
  Filled 2013-11-03: qty 2

## 2013-11-03 MED ORDER — MENTHOL 3 MG MT LOZG: 1.0000 | LOZENGE | OROMUCOSAL | Status: DC | PRN

## 2013-11-03 MED ORDER — LACTATED RINGERS IV SOLN: INTRAVENOUS | Status: DC

## 2013-11-03 MED ORDER — SODIUM CHLORIDE 0.9 % IJ SOLN
INTRAMUSCULAR | Status: AC
  Filled 2013-11-03: qty 20

## 2013-11-03 MED ORDER — CHLORHEXIDINE GLUCONATE 4 % EX LIQD: 60.0000 mL | Freq: Once | CUTANEOUS | Status: DC

## 2013-11-03 MED ORDER — PROPOFOL INFUSION 10 MG/ML OPTIME
INTRAVENOUS | Status: DC | PRN
  Administered 2013-11-03: 120 ug/kg/min via INTRAVENOUS

## 2013-11-03 MED ORDER — MIDAZOLAM HCL 5 MG/5ML IJ SOLN
INTRAMUSCULAR | Status: DC | PRN
  Administered 2013-11-03 (×2): 1 mg via INTRAVENOUS

## 2013-11-03 MED ORDER — PROPOFOL 10 MG/ML IV BOLUS
INTRAVENOUS | Status: AC
  Filled 2013-11-03: qty 20

## 2013-11-03 MED ORDER — BUPIVACAINE LIPOSOME 1.3 % IJ SUSP
20.0000 mL | Freq: Once | INTRAMUSCULAR | Status: DC
  Filled 2013-11-03: qty 20

## 2013-11-03 MED ORDER — METOCLOPRAMIDE HCL 10 MG PO TABS: 5.0000 mg | ORAL_TABLET | Freq: Three times a day (TID) | ORAL | Status: DC | PRN

## 2013-11-03 MED ORDER — FERROUS SULFATE 325 (65 FE) MG PO TABS
325.0000 mg | ORAL_TABLET | Freq: Three times a day (TID) | ORAL | Status: DC
  Administered 2013-11-03 – 2013-11-05 (×2): 325 mg via ORAL
  Filled 2013-11-03 (×8): qty 1

## 2013-11-03 MED ORDER — CEFAZOLIN SODIUM-DEXTROSE 2-3 GM-% IV SOLR
INTRAVENOUS | Status: AC
  Filled 2013-11-03: qty 50

## 2013-11-03 MED ORDER — FLEET ENEMA 7-19 GM/118ML RE ENEM: 1.0000 | ENEMA | Freq: Once | RECTAL | Status: AC | PRN

## 2013-11-03 MED ORDER — DEXAMETHASONE SODIUM PHOSPHATE 10 MG/ML IJ SOLN
10.0000 mg | Freq: Once | INTRAMUSCULAR | Status: AC
  Administered 2013-11-03: 10 mg via INTRAVENOUS

## 2013-11-03 MED ORDER — KETOROLAC TROMETHAMINE 30 MG/ML IJ SOLN
INTRAMUSCULAR | Status: AC
  Filled 2013-11-03: qty 1

## 2013-11-03 MED ORDER — POLYETHYLENE GLYCOL 3350 17 G PO PACK: 17.0000 g | PACK | Freq: Two times a day (BID) | ORAL | Status: DC

## 2013-11-03 MED ORDER — BUPIVACAINE-EPINEPHRINE (PF) 0.25% -1:200000 IJ SOLN
INTRAMUSCULAR | Status: DC | PRN
  Administered 2013-11-03: 25 mL

## 2013-11-03 MED ORDER — CELECOXIB 200 MG PO CAPS
200.0000 mg | ORAL_CAPSULE | Freq: Two times a day (BID) | ORAL | Status: DC
  Administered 2013-11-03 – 2013-11-05 (×5): 200 mg via ORAL
  Filled 2013-11-03 (×6): qty 1

## 2013-11-03 MED ORDER — BISACODYL 10 MG RE SUPP: 10.0000 mg | Freq: Every day | RECTAL | Status: DC | PRN

## 2013-11-03 MED ORDER — ZOLPIDEM TARTRATE 5 MG PO TABS
5.0000 mg | ORAL_TABLET | Freq: Every evening | ORAL | Status: DC | PRN
  Administered 2013-11-03 – 2013-11-04 (×2): 5 mg via ORAL
  Filled 2013-11-03 (×2): qty 1

## 2013-11-03 MED ORDER — BUPIVACAINE IN DEXTROSE 0.75-8.25 % IT SOLN
INTRATHECAL | Status: DC | PRN
  Administered 2013-11-03: 10.5 mg via INTRATHECAL

## 2013-11-03 MED ORDER — ALUM & MAG HYDROXIDE-SIMETH 200-200-20 MG/5ML PO SUSP: 30.0000 mL | ORAL | Status: DC | PRN

## 2013-11-03 MED ORDER — DIPHENHYDRAMINE HCL 25 MG PO CAPS
25.0000 mg | ORAL_CAPSULE | Freq: Four times a day (QID) | ORAL | Status: DC | PRN
  Administered 2013-11-04 (×2): 25 mg via ORAL
  Filled 2013-11-03 (×2): qty 1

## 2013-11-03 MED ORDER — PREDNISONE 5 MG PO TABS
5.0000 mg | ORAL_TABLET | Freq: Every day | ORAL | Status: DC
  Administered 2013-11-04 – 2013-11-05 (×2): 5 mg via ORAL
  Filled 2013-11-03 (×3): qty 1

## 2013-11-03 MED ORDER — BUPIVACAINE-EPINEPHRINE PF 0.25-1:200000 % IJ SOLN
INTRAMUSCULAR | Status: AC
  Filled 2013-11-03: qty 30

## 2013-11-03 SURGICAL SUPPLY — 60 items
ADH SKN CLS APL DERMABOND .7 (GAUZE/BANDAGES/DRESSINGS) ×1
BAG SPEC THK2 15X12 ZIP CLS (MISCELLANEOUS) ×1
BAG ZIPLOCK 12X15 (MISCELLANEOUS) ×2 IMPLANT
BANDAGE ELASTIC 6 VELCRO ST LF (GAUZE/BANDAGES/DRESSINGS) ×2 IMPLANT
BANDAGE ESMARK 6X9 LF (GAUZE/BANDAGES/DRESSINGS) ×1 IMPLANT
BLADE SAW SGTL 13.0X1.19X90.0M (BLADE) ×2 IMPLANT
BNDG CMPR 9X6 STRL LF SNTH (GAUZE/BANDAGES/DRESSINGS) ×1
BNDG ESMARK 6X9 LF (GAUZE/BANDAGES/DRESSINGS) ×2
BOWL SMART MIX CTS (DISPOSABLE) ×2 IMPLANT
CAP KNEE ATTUNE W/DOME PAT ×2 IMPLANT
CEMENT HV SMART SET (Cement) ×4 IMPLANT
CUFF TOURN SGL QUICK 34 (TOURNIQUET CUFF) ×2
CUFF TRNQT CYL 34X4X40X1 (TOURNIQUET CUFF) ×1 IMPLANT
DECANTER SPIKE VIAL GLASS SM (MISCELLANEOUS) ×2 IMPLANT
DERMABOND ADVANCED (GAUZE/BANDAGES/DRESSINGS) ×1
DERMABOND ADVANCED .7 DNX12 (GAUZE/BANDAGES/DRESSINGS) ×1 IMPLANT
DRAPE EXTREMITY T 121X128X90 (DRAPE) ×2 IMPLANT
DRAPE POUCH INSTRU U-SHP 10X18 (DRAPES) ×2 IMPLANT
DRAPE U-SHAPE 47X51 STRL (DRAPES) ×2 IMPLANT
DRSG AQUACEL AG ADV 3.5X10 (GAUZE/BANDAGES/DRESSINGS) ×2 IMPLANT
DRSG TEGADERM 4X4.75 (GAUZE/BANDAGES/DRESSINGS) IMPLANT
DURAPREP 26ML APPLICATOR (WOUND CARE) ×4 IMPLANT
ELECT REM PT RETURN 9FT ADLT (ELECTROSURGICAL) ×2
ELECTRODE REM PT RTRN 9FT ADLT (ELECTROSURGICAL) ×1 IMPLANT
EVACUATOR 1/8 PVC DRAIN (DRAIN) IMPLANT
FACESHIELD LNG OPTICON STERILE (SAFETY) ×10 IMPLANT
GAUZE SPONGE 2X2 8PLY STRL LF (GAUZE/BANDAGES/DRESSINGS) IMPLANT
GLOVE BIOGEL PI IND STRL 7.5 (GLOVE) ×1 IMPLANT
GLOVE BIOGEL PI IND STRL 8 (GLOVE) ×1 IMPLANT
GLOVE BIOGEL PI INDICATOR 7.5 (GLOVE) ×1
GLOVE BIOGEL PI INDICATOR 8 (GLOVE) ×1
GLOVE ECLIPSE 8.0 STRL XLNG CF (GLOVE) ×2 IMPLANT
GLOVE ORTHO TXT STRL SZ7.5 (GLOVE) ×4 IMPLANT
GOWN SPEC L3 XXLG W/TWL (GOWN DISPOSABLE) ×4 IMPLANT
GOWN STRL REUS W/ TWL XL LVL3 (GOWN DISPOSABLE) IMPLANT
GOWN STRL REUS W/TWL LRG LVL3 (GOWN DISPOSABLE) ×2 IMPLANT
GOWN STRL REUS W/TWL XL LVL3 (GOWN DISPOSABLE) ×2
HANDPIECE INTERPULSE COAX TIP (DISPOSABLE) ×2
KIT BASIN OR (CUSTOM PROCEDURE TRAY) ×2 IMPLANT
MANIFOLD NEPTUNE II (INSTRUMENTS) ×2 IMPLANT
NDL SAFETY ECLIPSE 18X1.5 (NEEDLE) ×1 IMPLANT
NEEDLE HYPO 18GX1.5 SHARP (NEEDLE) ×2
NS IRRIG 1000ML POUR BTL (IV SOLUTION) ×2 IMPLANT
PACK TOTAL JOINT (CUSTOM PROCEDURE TRAY) ×2 IMPLANT
POSITIONER SURGICAL ARM (MISCELLANEOUS) ×2 IMPLANT
SET HNDPC FAN SPRY TIP SCT (DISPOSABLE) ×1 IMPLANT
SET PAD KNEE POSITIONER (MISCELLANEOUS) ×2 IMPLANT
SPONGE GAUZE 2X2 STER 10/PKG (GAUZE/BANDAGES/DRESSINGS)
SUCTION FRAZIER 12FR DISP (SUCTIONS) ×2 IMPLANT
SUT MNCRL AB 4-0 PS2 18 (SUTURE) ×2 IMPLANT
SUT VIC AB 1 CT1 36 (SUTURE) ×2 IMPLANT
SUT VIC AB 2-0 CT1 27 (SUTURE) ×6
SUT VIC AB 2-0 CT1 TAPERPNT 27 (SUTURE) ×3 IMPLANT
SUT VLOC 180 0 24IN GS25 (SUTURE) ×2 IMPLANT
SYR 50ML LL SCALE MARK (SYRINGE) ×2 IMPLANT
TOWEL OR 17X26 10 PK STRL BLUE (TOWEL DISPOSABLE) ×2 IMPLANT
TOWEL OR NON WOVEN STRL DISP B (DISPOSABLE) IMPLANT
TRAY FOLEY CATH 14FRSI W/METER (CATHETERS) ×2 IMPLANT
WATER STERILE IRR 1500ML POUR (IV SOLUTION) ×2 IMPLANT
WRAP KNEE MAXI GEL POST OP (GAUZE/BANDAGES/DRESSINGS) ×2 IMPLANT

## 2013-11-03 NOTE — Evaluation (Signed)
Physical Therapy Evaluation Patient Details Name: [name] MRN: [id number] DOB: 01-Aug-1956 Today's Date: 11/03/2013 Time: 1510-1540 PT Time Calculation (min): 30 min  PT Assessment / Plan / Recommendation History of Present Illness  R TKA, h/o RA and cervical fusion  Clinical Impression  **Pt is s/p TKA resulting in the deficits listed below (see PT Problem List). * Pt will benefit from skilled PT to increase their independence and safety with mobility to allow discharge to the venue listed below.  Pt had excellent mobility on eval, excellent progress expected.  *    PT Assessment  Patient needs continued PT services    Follow Up Recommendations  SNF    Does the patient have the potential to tolerate intense rehabilitation      Barriers to Discharge   husband works, planning to go to [employer] with 5" wheels    Recommendations for Other Services     Frequency 7X/week    Precautions / Restrictions Precautions Precautions: Knee Restrictions Weight Bearing Restrictions: No Other Position/Activity Restrictions: WBAT   Pertinent Vitals/Pain *4/10 R knee Premedicated, ice applied**      Mobility  Bed Mobility Overal bed mobility: Needs Assistance Bed Mobility: Supine to Sit Supine to sit: Min guard General bed mobility comments: for RLE Transfers Overall transfer level: Needs assistance Equipment used: Rolling walker (2 wheeled) Transfers: Sit to/from Stand Sit to Stand: Min assist General transfer comment: VCs hand placement Ambulation/Gait Ambulation/Gait assistance: Min guard Ambulation Distance (Feet): 70 Feet Assistive device: Rolling walker (2 wheeled) Gait Pattern/deviations: Step-to pattern General Gait Details: VCs sequencing and to lift head    Exercises Total Joint Exercises Ankle Circles/Pumps: AROM;Both;10 reps;Supine Quad Sets: AROM;Right;5 reps;Supine Heel Slides: AAROM;Right;10  reps;Supine Straight Leg Raises: AROM;Right;5 reps;Supine Goniometric ROM: R knee flexion 55 degrees AAROM   PT Diagnosis: Difficulty walking;Acute pain  PT Problem List: Decreased strength;Decreased range of motion;Decreased activity tolerance;Decreased mobility;Pain PT Treatment Interventions: Gait training;DME instruction;Therapeutic activities;Therapeutic exercise;Patient/family education     PT Goals(Current goals can be found in the care plan section) Acute Rehab PT Goals Patient Stated Goal: to get back to my life PT Goal Formulation: With patient/family Time For Goal Achievement: 11/17/13 Potential to Achieve Goals: Good  Visit Information  Last PT Received On: 11/03/13 Assistance Needed: +1 History of Present Illness: R TKA, h/o RA and cervical fusion       Prior Functioning  Home Living Family/patient expects to be discharged to:: Skilled nursing facility Living Arrangements: Spouse/significant other Available Help at Discharge: Family;Available PRN/intermittently Type of Home: House Home Access: Stairs to enter Entrance Stairs-Number of Steps: 3 Home Layout: One level Prior Function Level of Independence: Independent Communication Communication: No difficulties    Cognition  Cognition Arousal/Alertness: Awake/alert Behavior During Therapy: WFL for tasks assessed/performed Overall Cognitive Status: Within Functional Limits for tasks assessed    Extremity/Trunk Assessment Upper Extremity Assessment Upper Extremity Assessment: Overall WFL for tasks assessed Lower Extremity Assessment Lower Extremity Assessment: RLE deficits/detail RLE Deficits / Details: R knee flexion AAROM 55 degrees, SLR 3/5, ankle WFL (some RA deformity) Cervical / Trunk Assessment Cervical / Trunk Assessment: Normal   Balance    End of Session PT - End of Session Equipment Utilized During Treatment: Gait belt Activity Tolerance: Patient tolerated treatment well Patient left: in  chair;with call bell/phone within reach;with family/visitor present Nurse Communication: Mobility status  GP     [name] 11/03/2013, 4:32 PM [phone]

## 2013-11-03 NOTE — Transfer of Care (Signed)
Immediate Anesthesia Transfer of Care Note  Patient: [name]  Procedure(s) Performed: Procedure(s): RIGHT TOTAL KNEE ARTHROPLASTY (Right)  Patient Location: PACU  Anesthesia Type:Spinal  Level of Consciousness: awake, alert , oriented and patient cooperative  Airway & Oxygen Therapy: Patient Spontanous Breathing and Patient connected to nasal cannula oxygen  Post-op Assessment: Report given to PACU RN and Post -op Vital signs reviewed and stable  Post vital signs: stable  Complications: No apparent anesthesia complications L2 spinal level

## 2013-11-03 NOTE — Anesthesia Preprocedure Evaluation (Addendum)
Anesthesia Evaluation  Patient identified by MRN, date of birth, ID band Patient awake    Reviewed: Allergy & Precautions, H&P , NPO status , Patient's Chart, lab work & pertinent test results  History of Anesthesia Complications (+) PONV  Airway Mallampati: II TM Distance: >3 FB Neck ROM: Limited    Dental no notable dental hx.    Pulmonary neg pulmonary ROS,  breath sounds clear to auscultation  Pulmonary exam normal       Cardiovascular hypertension, Pt. on medications Rhythm:Regular Rate:Normal     Neuro/Psych negative neurological ROS  negative psych ROS   GI/Hepatic negative GI ROS, Neg liver ROS,   Endo/Other  negative endocrine ROS  Renal/GU negative Renal ROS  negative genitourinary   Musculoskeletal   Abdominal   Peds negative pediatric ROS (+)  Hematology negative hematology ROS (+)   Anesthesia Other Findings   Reproductive/Obstetrics negative OB ROS                          Anesthesia Physical Anesthesia Plan  ASA: II  Anesthesia Plan: Spinal   Post-op Pain Management:    Induction:   Airway Management Planned: Simple Face Mask  Additional Equipment:   Intra-op Plan:   Post-operative Plan:   Informed Consent: I have reviewed the patients History and Physical, chart, labs and discussed the procedure including the risks, benefits and alternatives for the proposed anesthesia with the patient or authorized representative who has indicated his/her understanding and acceptance.   Dental advisory given  Plan Discussed with: CRNA  Anesthesia Plan Comments:         Anesthesia Quick Evaluation

## 2013-11-03 NOTE — Brief Op Note (Signed)
11/03/2013  11:40 AM  PATIENT:  [name]  58 y.o. female  PRE-OPERATIVE DIAGNOSIS:  RIGHT KNEE rheumatoid arthritis  POST-OPERATIVE DIAGNOSIS:  RIGHT KNEE rheumatoid arthritis  PROCEDURE:  Procedure(s): RIGHT TOTAL KNEE ARTHROPLASTY (Right)  Attune knee - Depuy, 5N/12/21/33 anatomic patella  SURGEON:  Surgeon(s) and Role:    * [name], MD - Primary  PHYSICIAN ASSISTANT: Lanney Gins, PA-C  ANESTHESIA:   spinal  EBL:  Total I/O In: 1000 [I.V.:1000] Out: -   BLOOD ADMINISTERED:none  DRAINS: none   LOCAL MEDICATIONS USED:  Exparel/ Marcaine combination  SPECIMEN:  No Specimen  DISPOSITION OF SPECIMEN:  N/A  COUNTS:  YES  TOURNIQUET:   Total Tourniquet Time Documented: Thigh (Right) - 32 minutes Total: Thigh (Right) - 32 minutes   DICTATION: .Other Dictation: Dictation Number [id number]  PLAN OF CARE: Admit to inpatient   PATIENT DISPOSITION:  PACU - hemodynamically stable.   Delay start of Pharmacological VTE agent (>24hrs) due to surgical blood loss or risk of bleeding: No

## 2013-11-03 NOTE — Progress Notes (Signed)
Pt arrived to floor by PACU. A&Ox4, bilateral pedal pulses +2, clear lung sounds. Will continue to monitor.

## 2013-11-03 NOTE — Anesthesia Postprocedure Evaluation (Signed)
  Anesthesia Post-op Note  Patient: [name]  Procedure(s) Performed: Procedure(s) (LRB): RIGHT TOTAL KNEE ARTHROPLASTY (Right)  Patient Location: PACU  Anesthesia Type: Spinal  Level of Consciousness: awake and alert   Airway and Oxygen Therapy: Patient Spontanous Breathing  Post-op Pain: mild  Post-op Assessment: Post-op Vital signs reviewed, Patient's Cardiovascular Status Stable, Respiratory Function Stable, Patent Airway and No signs of Nausea or vomiting  Last Vitals:  Filed Vitals:   11/03/13 1645  BP: 151/83  Pulse: 81  Temp: 36.9 C  Resp: 16    Post-op Vital Signs: stable   Complications: No apparent anesthesia complications

## 2013-11-03 NOTE — Plan of Care (Signed)
Problem: Phase I Progression Outcomes Goal: Pain controlled with appropriate interventions Outcome: Progressing Pt c/o pain 7/10. Scheduled Norco given, decreased pain to 5/10. Dilaudid IV given for breakthrough pain.

## 2013-11-03 NOTE — Interval H&P Note (Signed)
History and Physical Interval Note:  11/03/2013 9:22 AM  [name]  has presented today for surgery, with the diagnosis of RIGHT KNEE OA   The various methods of treatment have been discussed with the patient and family. After consideration of risks, benefits and other options for treatment, the patient has consented to  Procedure(s): RIGHT TOTAL KNEE ARTHROPLASTY (Right) as a surgical intervention .  The patient's history has been reviewed, patient examined, no change in status, stable for surgery.  I have reviewed the patient's chart and labs.  Questions were answered to the patient's satisfaction.     [name]

## 2013-11-04 LAB — CBC
HEMATOCRIT: 29.5 % — AB (ref 36.0–46.0)
Hemoglobin: 9.8 g/dL — ABNORMAL LOW (ref 12.0–15.0)
MCH: 30.2 pg (ref 26.0–34.0)
MCHC: 33.2 g/dL (ref 30.0–36.0)
MCV: 91 fL (ref 78.0–100.0)
Platelets: 274 10*3/uL (ref 150–400)
RBC: 3.24 MIL/uL — AB (ref 3.87–5.11)
RDW: 13.3 % (ref 11.5–15.5)
WBC: 13.7 10*3/uL — ABNORMAL HIGH (ref 4.0–10.5)

## 2013-11-04 LAB — BASIC METABOLIC PANEL
BUN: 16 mg/dL (ref 6–23)
CHLORIDE: 107 meq/L (ref 96–112)
CO2: 23 meq/L (ref 19–32)
CREATININE: 0.76 mg/dL (ref 0.50–1.10)
Calcium: 8.1 mg/dL — ABNORMAL LOW (ref 8.4–10.5)
GFR calc Af Amer: >90 mL/min (ref >90)
GFR calc non Af Amer: >90 mL/min (ref >90)
Glucose, Bld: 131 mg/dL — ABNORMAL HIGH (ref 70–99)
Potassium: 4.6 mEq/L (ref 3.7–5.3)
Sodium: 141 mEq/L (ref 137–147)

## 2013-11-04 MED ORDER — PROMETHAZINE HCL 25 MG/ML IJ SOLN
6.2500 mg | Freq: Four times a day (QID) | INTRAMUSCULAR | Status: DC | PRN
  Administered 2013-11-04: 6.25 mg via INTRAVENOUS
  Filled 2013-11-04: qty 1

## 2013-11-04 MED ORDER — KETOROLAC TROMETHAMINE 15 MG/ML IJ SOLN
7.5000 mg | Freq: Four times a day (QID) | INTRAMUSCULAR | Status: DC
  Administered 2013-11-04 – 2013-11-05 (×4): 7.5 mg via INTRAVENOUS
  Filled 2013-11-04 (×8): qty 1

## 2013-11-04 NOTE — Progress Notes (Signed)
Physical Therapy Treatment Patient Details Name: [name] MRN: [id number] DOB: [birth year] Today's Date: 11/04/2013 Time:10:50-11:14PT Time Calculation (min): 25 min  PT Assessment / Plan / Recommendation  History of Present Illness R TKA, h/o RA and cervical fusion   PT Comments   **progressing well with mobility*  Follow Up Recommendations  SNF     Does the patient have the potential to tolerate intense rehabilitation     Barriers to Discharge        Equipment Recommendations  Rolling walker with 5" wheels    Recommendations for Other Services    Frequency 7X/week   Progress towards PT Goals Progress towards PT goals: Progressing toward goals  Plan Current plan remains appropriate    Precautions / Restrictions Precautions Precautions: Knee Restrictions Weight Bearing Restrictions: No Other Position/Activity Restrictions: WBAT   Pertinent Vitals/Pain *7/10 R knee with walking Premedicated, ice applied**    Mobility  Transfers Overall transfer level: Needs assistance Equipment used: Rolling walker (2 wheeled) Transfers: Sit to/from Stand Sit to Stand: Supervision General transfer comment: VCs hand placement Ambulation/Gait Ambulation/Gait assistance: Modified independent (Device/Increase time) Ambulation Distance (Feet): 120 Feet Assistive device: Rolling walker (2 wheeled) Gait Pattern/deviations: Step-to pattern General Gait Details: steady, good sequencing, VCs to relax shoulders    Exercises Total Joint Exercises Ankle Circles/Pumps: AROM;Both;10 reps;Supine Quad Sets: AROM;Right;Supine;10 reps Short Arc QuadBarbaraann Cherry;Right;10 reps;Supine Heel Slides: AAROM;Right;10 reps;Supine Hip ABduction/ADduction: AROM;Right;10 reps;Supine Straight Leg Raises: AROM;Right;Supine;10 reps Long Arc Quad: AROM;Right;10 reps;Seated Knee Flexion: AROM;Right;10 reps;Seated Goniometric ROM: 80* R knee flexion AAROM, independent SLR   PT Diagnosis:    PT Problem  List:   PT Treatment Interventions:     PT Goals (current goals can now be found in the care plan section) Acute Rehab PT Goals Patient Stated Goal: to get back to my life PT Goal Formulation: With patient/family Time For Goal Achievement: 11/17/13 Potential to Achieve Goals: Good  Visit Information  Last PT Received On: 11/04/13 History of Present Illness: R TKA, h/o RA and cervical fusion    Subjective Data  Patient Stated Goal: to get back to my life   Cognition  Cognition Arousal/Alertness: Awake/alert Behavior During Therapy: WFL for tasks assessed/performed Overall Cognitive Status: Within Functional Limits for tasks assessed    Balance     End of Session PT - End of Session Equipment Utilized During Treatment: Gait belt Activity Tolerance: Patient tolerated treatment well Patient left: in chair;with call bell/phone within reach;with family/visitor present Nurse Communication: Mobility status   GP     [name] 11/04/2013, 2:14 PM [phone]

## 2013-11-04 NOTE — Progress Notes (Signed)
   Subjective: 1 Day Post-Op Procedure(s) (LRB): RIGHT TOTAL KNEE ARTHROPLASTY (Right)   Patient reports pain as moderate, pain controlled with medication. No events throughout the night.  Objective:   VITALS:   Filed Vitals:   11/04/13 0519  BP: 127/79  Pulse: 81  Temp: 97.9 F (36.6 C)  Resp: 17    Neurovascular intact Dorsiflexion/Plantar flexion intact Incision: dressing C/D/I No cellulitis present Compartment soft  LABS  Recent Labs  11/04/13 0454  HGB 9.8*  HCT 29.5*  WBC 13.7*  PLT 274     Recent Labs  11/04/13 0454  NA 141  K 4.6  BUN 16  CREATININE 0.76  GLUCOSE 131*     Assessment/Plan: 1 Day Post-Op Procedure(s) (LRB): RIGHT TOTAL KNEE ARTHROPLASTY (Right) Foley cath d/c'ed Advance diet Up with therapy D/C IV fluids Discharge to SNF, eventually when ready  Expected ABLA  Treated with iron and will observe       Anastasio Auerbach. Franceen Erisman   PAC  11/04/2013, 8:33 AM

## 2013-11-04 NOTE — Progress Notes (Signed)
Clinical Social Work Department CLINICAL SOCIAL WORK PLACEMENT NOTE 11/04/2013  Patient:  [name], [name]  Account Number:  [id number] Admit date:  11/03/2013  Clinical Social Worker:  Cori Razor, LCSW  Date/time:  11/04/2013 03:22 PM  Clinical Social Work is seeking post-discharge placement for this patient at the following level of care:   SKILLED NURSING   (*CSW will update this form in Epic as items are completed)     Patient/family provided with [facility] Department of Clinical Social Work's list of facilities offering this level of care within the geographic area requested by the patient (or if unable, by the patient's family).  11/04/2013  Patient/family informed of their freedom to choose among providers that offer the needed level of care, that participate in Medicare, Medicaid or managed care program needed by the patient, have an available bed and are willing to accept the patient.    Patient/family informed of [facility]' ownership interest in [facility], as well as of the fact that they are under no obligation to receive care at this facility.  PASARR submitted to EDS on 11/04/2013 PASARR number received from [employer] on 11/04/2013  FL2 transmitted to all facilities in geographic area requested by pt/family on  11/04/2013 FL2 transmitted to all facilities within larger geographic area on   Patient informed that his/her managed care company has contracts with or will negotiate with  certain facilities, including the following:     Patient/family informed of bed offers received:  11/04/2013 Patient chooses bed at [facility] Physician recommends and patient chooses bed at    Patient to be transferred to [facility] on   Patient to be transferred to facility by   The following physician request were entered in Epic:   Additional Comments:   Cori Razor LCSW [phone]

## 2013-11-04 NOTE — Progress Notes (Signed)
Clinical Social Work Department BRIEF PSYCHOSOCIAL ASSESSMENT 11/04/2013  Patient:  [name], [name]     Account Number:  [id number]     Admit date:  11/03/2013  Clinical Social Worker:  Lacie Scotts  Date/Time:  11/04/2013 03:15 PM  Referred by:  Physician  Date Referred:  11/04/2013 Referred for  SNF Placement   Other Referral:   Interview type:  Patient Other interview type:    PSYCHOSOCIAL DATA Living Status:  HUSBAND Admitted from facility:   Level of care:   Primary support name:  Rober Minion Primary support relationship to patient:  SPOUSE Degree of support available:   supportive    CURRENT CONCERNS Current Concerns  Post-Acute Placement   Other Concerns:    SOCIAL WORK ASSESSMENT / PLAN Pt is a 58 yr old female living at home prior to hospitalization. CSW met with pt to assist with d/c planning. Pt has made prior arrangements to have ST Rehab at [facility] following hospital d/c. CSW has contacted SNF and d/c plan has been confirmed pending BCBS authorization. Insurance Josem Kaufmann has been initiated and a decision is pending. CSW will continue to follow to assist with d/c planning needs.   Assessment/plan status:  Psychosocial Support/Ongoing Assessment of Needs Other assessment/ plan:   Information/referral to community resources:   Insurance authorization process reviewed.    PATIENT'S/FAMILY'S RESPONSE TO PLAN OF CARE: Pt is looking forward to having rehab at [facility].    Werner Lean LCSW [phone]

## 2013-11-04 NOTE — Op Note (Signed)
[name], [name]             ACCOUNT NO.:  [id number]  MEDICAL RECORD NO.:  [id number]  LOCATION:  1618                         FACILITY:  [facility]  PHYSICIAN:  Madlyn Frankel. Charlann Boxer, M.D.  DATE OF BIRTH:  1956-08-17  DATE OF PROCEDURE:  11/03/2013 DATE OF DISCHARGE:                              OPERATIVE REPORT   PREOPERATIVE DIAGNOSIS:  Right knee rheumatoid arthritis with advanced degenerative joint changes.  POSTOPERATIVE DIAGNOSIS:  Right knee rheumatoid arthritis with advanced degenerative joint changes.  PROCEDURE:  Right total knee arthroplasty utilizing Attune with a size 5 narrow femoral component, size 3 tibial base plate, a size 8 rotating platform posterior stabilized insert, and a 35 anatomic patellar button.  SURGEON:  Madlyn Frankel. Charlann Boxer, MD  ASSISTANT:  Lanney Gins, PA-C.  Note that Mr. Carmon Sails was present for the entirety of the case from preoperative position, perioperative management of the upper extremity, general facilitation of the case, and primary wound closure.  ANESTHESIA:  Spinal.  SPECIMENS:  None.  COMPLICATION:  None.  DRAINS:  None.  INDICATIONS FOR PROCEDURE:  Mrs. Stringfield is a 58 year old female with advanced rheumatoid arthritis, with progressive worsening of right knee symptoms.  Radiographs revealed advanced degenerative joint change with complete loss of joint space, tricompartmental osteophytes.  She was noted to have significant upper and lower extremity deformities on her feet, foot, and ankle, and hands predominantly.  Given the failure to respond to conservative measures, long term, she at this point, is ready to proceed with knee arthroplasty.  Reviewed the risks and benefits of knee arthroplasty and consent was obtained.  Specific risks of infection, DVT, component failure, need for future revision, surgery were discussed.  PROCEDURE IN DETAIL:  The patient was brought to operative theater. Once adequate anesthesia, preoperative  antibiotics, Ancef administered, she was positioned supine with the right thigh tourniquet placed.  The right lower extremity was prepped and draped in sterile fashion and the right foot placed in DeMayo leg holder.  A time-out was performed identifying the patient, planned procedure, and extremity.  Leg was exsanguinated.  Tourniquet elevated to 250 mmHg.  Midline incision was used, followed by soft tissue exposure.  Median arthrotomy was made encountering clear synovial fluid with obvious synovial proliferation synovitis.  Following initial synovectomy in medial and lateral suprapatellar region, attention was first directed to patella.  Precut measurement was only 21 mm.  I resected down just to 14 mm and used a 35 anatomic button for this area, to cut surface.  Lug holes were drilled, making certain that the patella would lie perpendicular to the plane of the components.  The patellar button was placed on the patella to protect it from retractors and saw blades.  At this point, attention was directed to the femur.  Femoral canal was opened with a drill, irrigated to try to prevent fat emboli based on a little bit of hyperextension before preoperatively as well as per the technique and expectations at this point, I resected only 8 mm of distal femur, measured at 3 degrees of valgus off the distal femur.  Following this resection, the tibia was subluxated anteriorly and using an extramedullary guide positioned with 3 degrees of slope for  the system, 8 mm of bone resected off the distal femur, there is proximal tibia.  Based off the lateral side, at this point, we confirmed the extension gap was stable with at least a size 5 insert.  We also confirmed the cut was perpendicular in coronal plane to her tibia not evaluating based on her foot and ankle, based on significant planovalgus, foot deformity related to rheumatoid arthritis and most likely, posterior tib dysfunction.  At  this point, we attended back to the femur.  We placed intramedullary guide for not only sizing, but also maximizing and stabilizing our extension gap, maximized our flexion gap stability in relationship to the extension gap.  We sized the femur to be a size 5, then applying tension on the medial and lateral collateral ligaments, we were able to accurately reproduce the extension gap of at least size 5 insert at this point.  The size 5 block was then pinned into position.  The anterior, posterior, and chamfer cuts were then made.  The final box cut was made based off the lateral aspect of the femur.  I was going to select the 5 narrow component based on this.  At this point, the tibia was subluxated anteriorly and the cut surface of the tibia seemed to be best fit with size 3 tibial tray.  The size 3 tibia tray was pinned into position based on her proximal tibial anatomy ahead to be slightly internally rotated but with a rotating platform design this was not a hindrance.  The drilled and keel punched in preparation for the 3 tibial tray. Trial reduction was now carried out with the components in place, I felt that I was going to need at least a size 6 insert and most likely a little bit thicker to balance the extension and flexion gaps.  Nonetheless, the patella tracked through the trochlea without application of pressure.  Given these findings, the synovial capsule junction was injected with Exparel 20 mL, 30 mL of 0.25% Marcaine with epinephrine and 1 mL of Toradol.  The trial components were removed from the knee.  The knee was irrigated with normal saline solution and pulse lavage as final components were opened and cement was mixed.  Final components were then cemented into place.  I brought the knee to extension with an 8 mm trial with the knee coming to full extension, extruded cement was removed.  Once the cement had fully cured, excessive cement was removed throughout the  knee.  The final size 8 posterior stabilized rotating platform insert to match the size 5 femur was then placed in the tibia.  The knee came to full extension and good stable ligaments from extension to flexion and the patella tracked through the trochlea without application pressure.  At this point, the knee was re-irrigated with normal saline solution. Tourniquet had been let down after 32 minutes.  The extensor mechanism was then reapproximated with the knee in flexion using #1 Vicryl and 0 V- Loc suture. The remaining wound was closed with 2-0 Vicryl and running 4-0 Monocryl. The knee was cleaned, dried, and dressed sterilely using Dermabond Aquacel dressing.  The knee was wrapped in Ace wrap.  She was then brought to the recovery room in stable condition, tolerating the procedure well.     Madlyn Frankel Charlann Boxer, M.D.     MDO/MEDQ  D:  11/03/2013  T:  11/03/2013  Job:  [id number]

## 2013-11-04 NOTE — Plan of Care (Signed)
Problem: Consults Goal: Diagnosis- Total Joint Replacement Outcome: Completed/Met Date Met:  11/04/13 Primary Total Knee RIGHT

## 2013-11-04 NOTE — Evaluation (Signed)
Occupational Therapy Evaluation Patient Details Name: [name] MRN: [id number] DOB: 1956-07-26 Today's Date: 11/04/2013 Time: 0831-0900 OT Time Calculation (min): 29 min  OT Assessment / Plan / Recommendation History of present illness R TKA, h/o RA and cervical fusion   Clinical Impression   Pt seen for OT to work on functional transfers and ADL. Will benefit from skilled OT services to maximize independence for next venue of care. Pt is very motivated.    OT Assessment  Patient needs continued OT Services    Follow Up Recommendations  SNF;Supervision/Assistance - 24 hour    Barriers to Discharge      Equipment Recommendations  3 in 1 bedside comode    Recommendations for Other Services    Frequency  Min 2X/week    Precautions / Restrictions Precautions Precautions: Knee Restrictions Weight Bearing Restrictions: No Other Position/Activity Restrictions: WBAT   Pertinent Vitals/Pain 8/10 R knee; hadnt had pain meds but agreeable up to chair (nursing in room to give in the middle of session)    ADL  Eating/Feeding: Independent Where Assessed - Eating/Feeding: Chair Grooming: Wash/dry hands;Set up Where Assessed - Grooming: Supported sitting Upper Body Bathing: Chest;Right arm;Left arm;Abdomen;Set up Where Assessed - Upper Body Bathing: Unsupported sitting Lower Body Bathing: Minimal assistance Where Assessed - Lower Body Bathing: Supported sit to stand Upper Body Dressing: Set up Where Assessed - Upper Body Dressing: Unsupported sitting Lower Body Dressing: Moderate assistance Where Assessed - Lower Body Dressing: Supported sit to stand Toilet Transfer: Hydrographic surveyor Method: Stand pivot Toileting - Architect and Hygiene: Min guard Where Assessed - Engineer, mining and Hygiene: Standing Equipment Used: Rolling walker ADL Comments: Pt stating she had nausea early this am but didnt report any nausea during session.  Discussed/encouraged her to work on functional reach to R foot for dressing/bathing to increase flexibility/ROm at knee.     OT Diagnosis: Generalized weakness  OT Problem List: Decreased strength;Decreased knowledge of use of DME or AE OT Treatment Interventions: Self-care/ADL training;DME and/or AE instruction;Therapeutic activities;Patient/family education   OT Goals(Current goals can be found in the care plan section) Acute Rehab OT Goals Patient Stated Goal: get back to being independent OT Goal Formulation: With patient Time For Goal Achievement: 11/11/13 Potential to Achieve Goals: Good  Visit Information  Last OT Received On: 11/04/13 Assistance Needed: +1 History of Present Illness: R TKA, h/o RA and cervical fusion       Prior Functioning     Home Living Family/patient expects to be discharged to:: Skilled nursing facility Living Arrangements: Spouse/significant other Available Help at Discharge: Family;Available PRN/intermittently Type of Home: House Home Access: Stairs to enter [employer] of Steps: 3 Home Layout: One level Prior Function Level of Independence: Independent Communication Communication: No difficulties         Vision/Perception     Cognition  Cognition Arousal/Alertness: Awake/alert Behavior During Therapy: WFL for tasks assessed/performed Overall Cognitive Status: Within Functional Limits for tasks assessed    Extremity/Trunk Assessment Upper Extremity Assessment Upper Extremity Assessment: RUE deficits/detail;LUE deficits/detail RUE Deficits / Details: pt has some limitations due to RA especially in hands. L> R hand. Slightly decreased L shoulder flexion compared to R. LUE Deficits / Details: see above     Mobility Bed Mobility Bed Mobility: Supine to Sit Supine to sit: Supervision Transfers Overall transfer level: Needs assistance Equipment used: Rolling walker (2 wheeled) Transfers: Sit to/from Stand Sit to Stand:  Min guard General transfer comment: VCs hand placement  Exercise     Balance     End of Session OT - End of Session Equipment Utilized During Treatment: Rolling walker Activity Tolerance: Patient tolerated treatment well Patient left: in chair;with call bell/phone within reach  GO     Lennox Laity [phone] 11/04/2013, 9:29 AM

## 2013-11-04 NOTE — Progress Notes (Signed)
Physical Therapy Treatment Patient Details Name: [name] MRN: [id number] DOB: 1955/10/25 Today's Date: 11/04/2013 Time: 4536-4680 PT Time Calculation (min): 25 min  PT Assessment / Plan / Recommendation  History of Present Illness R TKA, h/o RA and cervical fusion   PT Comments   *Excellent progress with mobility. Will be ready to DC to SNF tomorrow. OK to ride in car to SNF from PT standpoint.**  Follow Up Recommendations  SNF     Does the patient have the potential to tolerate intense rehabilitation     Barriers to Discharge        Equipment Recommendations  Rolling walker with 5" wheels    Recommendations for Other Services    Frequency 7X/week   Progress towards PT Goals Progress towards PT goals: Progressing toward goals  Plan Current plan remains appropriate    Precautions / Restrictions Precautions Precautions: Knee Restrictions Weight Bearing Restrictions: No Other Position/Activity Restrictions: WBAT   Pertinent Vitals/Pain **7/10 R knee with activity Premedicated, ice applied*    Mobility  Transfers Overall transfer level: Needs assistance Equipment used: Rolling walker (2 wheeled) Transfers: Sit to/from Stand Sit to Stand: Supervision General transfer comment: VCs hand placement Ambulation/Gait Ambulation/Gait assistance: Modified independent (Device/Increase time) Ambulation Distance (Feet): 120 Feet Assistive device: Rolling walker (2 wheeled) Gait Pattern/deviations: Step-to pattern General Gait Details: steady, good sequencing, VCs to relax shoulders    Exercises Total Joint Exercises Ankle Circles/Pumps: AROM;Both;10 reps;Supine Quad Sets: AROM;Right;Supine;10 reps Short Arc QuadBarbaraann Boys;Right;10 reps;Supine Heel Slides: AAROM;Right;10 reps;Supine Hip ABduction/ADduction: AROM;Right;10 reps;Supine Straight Leg Raises: AROM;Right;Supine;10 reps Long Arc Quad: AROM;Right;10 reps;Seated Knee Flexion: AROM;Right;10  reps;Seated Goniometric ROM: 80* R knee flexion AAROM, independent SLR   PT Diagnosis:    PT Problem List:   PT Treatment Interventions:     PT Goals (current goals can now be found in the care plan section) Acute Rehab PT Goals Patient Stated Goal: to get back to my life PT Goal Formulation: With patient/family Time For Goal Achievement: 11/17/13 Potential to Achieve Goals: Good  Visit Information  Last PT Received On: 11/04/13 History of Present Illness: R TKA, h/o RA and cervical fusion    Subjective Data  Patient Stated Goal: to get back to my life   Cognition  Cognition Arousal/Alertness: Awake/alert Behavior During Therapy: WFL for tasks assessed/performed Overall Cognitive Status: Within Functional Limits for tasks assessed    Balance     End of Session PT - End of Session Equipment Utilized During Treatment: Gait belt Activity Tolerance: Patient tolerated treatment well Patient left: in chair;with call bell/phone within reach;with family/visitor present Nurse Communication: Mobility status   GP     [name] 11/04/2013, 1:32 PM [phone]

## 2013-11-04 NOTE — Care Management Note (Signed)
  Page 1 of 1   11/04/2013     3:03:17 PM   CARE MANAGEMENT NOTE 11/04/2013  Patient:  [name], [name]   Account Number:  [id number]  Date Initiated:  11/04/2013  Documentation initiated by:  Colleen Can  Subjective/Objective Assessment:   dx total rt knee replacemnt     Action/Plan:   Pt plans SNF rehab. CM will follow as needed.   Anticipated DC Date:  11/06/2013   Anticipated DC Plan:  SKILLED NURSING FACILITY  In-house referral  Clinical Social Worker      DC Planning Services  CM consult      Choice offered to / List presented to:             Status of service:  In process, will continue to follow Medicare Important Message given?   (If response is "NO", the following Medicare IM given date fields will be blank) Date Medicare IM given:   Date Additional Medicare IM given:    Discharge Disposition:    Per UR Regulation:  Reviewed for med. necessity/level of care/duration of stay  If discussed at Long Length of Stay Meetings, dates discussed:    Comments:

## 2013-11-05 DIAGNOSIS — D62 Acute posthemorrhagic anemia: Secondary | ICD-10-CM | POA: Diagnosis not present

## 2013-11-05 LAB — BASIC METABOLIC PANEL
BUN: 17 mg/dL (ref 6–23)
CHLORIDE: 109 meq/L (ref 96–112)
CO2: 26 meq/L (ref 19–32)
Calcium: 8.4 mg/dL (ref 8.4–10.5)
Creatinine, Ser: 0.79 mg/dL (ref 0.50–1.10)
GFR calc Af Amer: >90 mL/min (ref >90)
GFR calc non Af Amer: >90 mL/min (ref >90)
Glucose, Bld: 91 mg/dL (ref 70–99)
Potassium: 4.1 mEq/L (ref 3.7–5.3)
Sodium: 144 mEq/L (ref 137–147)

## 2013-11-05 LAB — CBC
HEMATOCRIT: 27.6 % — AB (ref 36.0–46.0)
Hemoglobin: 9.2 g/dL — ABNORMAL LOW (ref 12.0–15.0)
MCH: 30.3 pg (ref 26.0–34.0)
MCHC: 33.3 g/dL (ref 30.0–36.0)
MCV: 90.8 fL (ref 78.0–100.0)
Platelets: 238 10*3/uL (ref 150–400)
RBC: 3.04 MIL/uL — ABNORMAL LOW (ref 3.87–5.11)
RDW: 13.6 % (ref 11.5–15.5)
WBC: 9.9 10*3/uL (ref 4.0–10.5)

## 2013-11-05 MED ORDER — POLYETHYLENE GLYCOL 3350 17 G PO PACK: 17.0000 g | PACK | Freq: Two times a day (BID) | ORAL | Status: DC

## 2013-11-05 MED ORDER — DSS 100 MG PO CAPS: 100.0000 mg | ORAL_CAPSULE | Freq: Two times a day (BID) | ORAL | Status: DC

## 2013-11-05 MED ORDER — ASPIRIN 325 MG PO TBEC: 325.0000 mg | DELAYED_RELEASE_TABLET | Freq: Two times a day (BID) | ORAL | Status: AC

## 2013-11-05 MED ORDER — METHOCARBAMOL 500 MG PO TABS: 500.0000 mg | ORAL_TABLET | Freq: Four times a day (QID) | ORAL | Status: DC | PRN

## 2013-11-05 MED ORDER — HYDROCODONE-ACETAMINOPHEN 7.5-325 MG PO TABS: 1.0000 | ORAL_TABLET | ORAL | Status: DC | PRN

## 2013-11-05 MED ORDER — FERROUS SULFATE 325 (65 FE) MG PO TABS: 325.0000 mg | ORAL_TABLET | Freq: Three times a day (TID) | ORAL | Status: DC

## 2013-11-05 NOTE — Progress Notes (Signed)
Physical Therapy Treatment Patient Details Name: [name] MRN: [id number] DOB: 1956/09/02 Today's Date: 11/05/2013 Time: 2229-7989 PT Time Calculation (min): 25 min  PT Assessment / Plan / Recommendation  History of Present Illness R TKA, h/o RA and cervical fusion   PT Comments     Follow Up Recommendations  SNF     Does the patient have the potential to tolerate intense rehabilitation     Barriers to Discharge        Equipment Recommendations  Rolling walker with 5" wheels    Recommendations for Other Services    Frequency 7X/week   Progress towards PT Goals    Plan Current plan remains appropriate    Precautions / Restrictions Precautions Precautions: Knee Restrictions Weight Bearing Restrictions: No Other Position/Activity Restrictions: WBAT   Pertinent Vitals/Pain 4/10; premed, ice pack provided    Mobility  Bed Mobility Overal bed mobility: Needs Assistance Bed Mobility: Supine to Sit;Sit to Supine Supine to sit: Supervision Sit to supine: Supervision General bed mobility comments: cues for sequence Transfers Overall transfer level: Needs assistance Equipment used: Rolling walker (2 wheeled) Transfers: Sit to/from Stand Sit to Stand: Min guard General transfer comment: VCs hand placement and LE management Ambulation/Gait Ambulation/Gait assistance: Supervision Ambulation Distance (Feet): 200 Feet Assistive device: Rolling walker (2 wheeled) Gait Pattern/deviations: Step-to pattern;Step-through pattern;Shuffle;Trunk flexed General Gait Details: cues for position from RW for saftey    Exercises Total Joint Exercises Ankle Circles/Pumps: AROM;Both;Supine;20 reps Quad Sets: AROM;Right;Supine;10 reps Short Arc QuadBarbaraann Boys;Right;10 reps;Supine Heel Slides: AAROM;Right;Supine;20 reps Straight Leg Raises: AROM;Right;Supine;15 reps   PT Diagnosis:    PT Problem List:   PT Treatment Interventions:     PT Goals (current goals can now be found in  the care plan section) Acute Rehab PT Goals Patient Stated Goal: to get back to my life PT Goal Formulation: With patient/family Time For Goal Achievement: 11/17/13 Potential to Achieve Goals: Good  Visit Information  Last PT Received On: 11/05/13 Assistance Needed: +1 History of Present Illness: R TKA, h/o RA and cervical fusion    Subjective Data  Patient Stated Goal: to get back to my life   Cognition  Cognition Arousal/Alertness: Awake/alert Behavior During Therapy: WFL for tasks assessed/performed Overall Cognitive Status: Within Functional Limits for tasks assessed    Balance     End of Session PT - End of Session Equipment Utilized During Treatment: Gait belt Activity Tolerance: Patient tolerated treatment well Patient left: with call bell/phone within reach;with family/visitor present;in bed Nurse Communication: Mobility status   GP     Jaelle Campanile 11/05/2013, 4:10 PM

## 2013-11-05 NOTE — Progress Notes (Signed)
Physical Therapy Treatment Patient Details Name: [name] MRN: [id number] DOB: 18-Nov-1955 Today's Date: 11/05/2013 Time: 1660-6004 PT Time Calculation (min): 37 min  PT Assessment / Plan / Recommendation  History of Present Illness     PT Comments   Pt progressing well with mobility but ltd in basic ADL and ability to self care 2* recent TKR and arthritic changes.  Follow Up Recommendations  SNF     Does the patient have the potential to tolerate intense rehabilitation     Barriers to Discharge        Equipment Recommendations  Rolling walker with 5" wheels    Recommendations for Other Services    Frequency 7X/week   Progress towards PT Goals    Plan Current plan remains appropriate    Precautions / Restrictions Precautions Precautions: Knee Restrictions Weight Bearing Restrictions: No Other Position/Activity Restrictions: WBAT   Pertinent Vitals/Pain 5/10; premed, ice packs provided.    Mobility  Bed Mobility Overal bed mobility: Needs Assistance Bed Mobility: Supine to Sit Supine to sit: Supervision General bed mobility comments: cues for sequence Transfers Overall transfer level: Needs assistance Equipment used: Rolling walker (2 wheeled) Transfers: Sit to/from Stand Sit to Stand: Min guard General transfer comment: VCs hand placement and LE management Ambulation/Gait Ambulation/Gait assistance: Supervision Ambulation Distance (Feet): 85 Feet (twice) Assistive device: Rolling walker (2 wheeled) Gait Pattern/deviations: Step-to pattern;Step-through pattern;Shuffle General Gait Details: cues for position from RW for saftey    Exercises Total Joint Exercises Ankle Circles/Pumps: AROM;Both;Supine;20 reps Quad Sets: AROM;Right;Supine;20 reps Short Arc QuadBarbaraann Boys;Right;10 reps;Supine Heel Slides: AAROM;Right;Supine;20 reps Straight Leg Raises: AROM;Right;Supine;20 reps Goniometric ROM: AAROM at R knee to 85   PT Diagnosis:    PT Problem List:    PT Treatment Interventions:     PT Goals (current goals can now be found in the care plan section) Acute Rehab PT Goals Patient Stated Goal: to get back to my life PT Goal Formulation: With patient/family Time For Goal Achievement: 11/17/13 Potential to Achieve Goals: Good  Visit Information  Last PT Received On: 11/05/13 Assistance Needed: +1    Subjective Data  Subjective: It hurts more than yesterday Patient Stated Goal: to get back to my life   Cognition  Cognition Arousal/Alertness: Awake/alert Behavior During Therapy: WFL for tasks assessed/performed Overall Cognitive Status: Within Functional Limits for tasks assessed    Balance     End of Session PT - End of Session Equipment Utilized During Treatment: Gait belt Activity Tolerance: Patient tolerated treatment well Patient left: in chair;with call bell/phone within reach;with family/visitor present Nurse Communication: Mobility status   GP     Roseanne Juenger 11/05/2013, 8:52 AM

## 2013-11-05 NOTE — Progress Notes (Signed)
   Subjective: 2 Days Post-Op Procedure(s) (LRB): RIGHT TOTAL KNEE ARTHROPLASTY (Right)   Patient reports pain as mild, pain is under better control today. No events throughout the night.   Objective:   VITALS:   Filed Vitals:   11/05/13 0538  BP: 140/87  Pulse: 91  Temp: 98.5 F (36.9 C)  Resp: 20    Neurovascular intact Dorsiflexion/Plantar flexion intact Incision: dressing C/D/I No cellulitis present Compartment soft  LABS  Recent Labs  11/04/13 0454 11/05/13 0450  HGB 9.8* 9.2*  HCT 29.5* 27.6*  WBC 13.7* 9.9  PLT 274 238     Recent Labs  11/04/13 0454 11/05/13 0450  NA 141 144  K 4.6 4.1  BUN 16 17  CREATININE 0.76 0.79  GLUCOSE 131* 91     Assessment/Plan: 2 Days Post-Op Procedure(s) (LRB): RIGHT TOTAL KNEE ARTHROPLASTY (Right) Up with therapy Discharge to SNF when ready Follow up in 2 weeks at [facility]. Follow up with OLIN,Serenidy Waltz D in 2 weeks.  Contact information:  [facility] [address] [place] [address] [phone]    Expected ABLA  Treated with iron and will observe       Anastasio Auerbach. Tiron Suski   PAC  11/05/2013, 9:28 AM

## 2013-11-05 NOTE — Progress Notes (Signed)
Occupational Therapy Treatment Patient Details Name: ROSALI AUGELLO MRN: [id number] DOB: 08-Nov-1955 Today's Date: 11/05/2013 Time: 8309-4076 OT Time Calculation (min): 28 min  OT Assessment / Plan / Recommendation  History of present illness R TKA, h/o RA and cervical fusion   OT comments  Pt making good progress. Will benefit from skilled care to continue OT at d/c to increase independence with self care tasks as she is alone during the day at home.   Follow Up Recommendations  Supervision/Assistance - 24 hour;SNF    Barriers to Discharge       Equipment Recommendations  3 in 1 bedside comode    Recommendations for Other Services    Frequency Min 2X/week   Progress towards OT Goals Progress towards OT goals: Progressing toward goals  Plan Discharge plan remains appropriate    Precautions / Restrictions Precautions Precautions: Knee Restrictions Weight Bearing Restrictions: No Other Position/Activity Restrictions: WBAT   Pertinent Vitals/Pain 5/10 at rest 6/10 with activity R knee; reposition, ice    ADL  Upper Body Bathing:  (pt performed) Lower Body Bathing: Minimal assistance Where Assessed - Lower Body Bathing: Supported sit to stand Upper Body Dressing:  (pt performed) Lower Body Dressing: Minimal assistance;Moderate assistance (pt used reacher to start R pant leg, underwear over foot, min balance) Where Assessed - Lower Body Dressing: Supported sit to Pharmacist, hospital: [employer]or Method: Sit to stand Equipment Used: Rolling walker ADL Comments: Pt with decreased reach down to R foot so educated pt on option of a reacher to start pants, etc over foot. pt used reacher today to start underwear and pants over R foot and did well. She stood to pull up pants with min guard to min assist for balance. She also stood to wash periareas with min to min guard assist and walker. Educated pt on sequence of LB dressing safety with walker use. Pt is very  motivated.     OT Diagnosis:    OT Problem List:   OT Treatment Interventions:     OT Goals(current goals can now be found in the care plan section) Acute Rehab OT Goals Patient Stated Goal: to get back to my life  Visit Information  Last OT Received On: 11/05/13 Assistance Needed: +1 History of Present Illness: R TKA, h/o RA and cervical fusion    Subjective Data      Prior Functioning       Cognition  Cognition Arousal/Alertness: Awake/alert Behavior During Therapy: WFL for tasks assessed/performed Overall Cognitive Status: Within Functional Limits for tasks assessed    Mobility   Transfers Overall transfer level: Needs assistance Equipment used: Rolling walker (2 wheeled) Transfers: Sit to/from Stand Sit to Stand: Min guard General transfer comment: VCs hand placement and LE management       Balance General Comments General comments (skin integrity, edema, etc.): min guard to min assist with dynamic balance tasks to pull up pants, wash periareas in standing.  End of Session OT - End of Session Equipment Utilized During Treatment: Rolling walker Activity Tolerance: Patient tolerated treatment well Patient left: in chair;with call bell/phone within reach  GO     Lennox Laity [phone] 11/05/2013, 9:55 AM

## 2013-11-05 NOTE — Discharge Summary (Signed)
Physician Discharge Summary  Patient ID: [name] MRN: [id number] DOB/AGE: [birth year] 58 y.o.  Admit date: 11/03/2013 Discharge date:  11/05/2013  Procedures:  Procedure(s) (LRB): RIGHT TOTAL KNEE ARTHROPLASTY (Right)  Attending Physician:  Dr. Durene Romans   Admission Diagnoses:   Right knee OA / pain  Discharge Diagnoses:  Principal Problem:   S/P right TKA Active Problems:   Expected blood loss anemia  Past Medical History  Diagnosis Date  . Complication of anesthesia   . PONV (postoperative nausea and vomiting)   . Hypertension   . Arthritis   . Arthritis with psoriasis   . Osteoporosis   . Psoriasis   . Bruises easily   . Difficulty sleeping   . History of nonmelanoma skin cancer     HPI: [name], 58 y.o. female, has a history of pain and functional disability in the right knee due to arthritis and has failed non-surgical conservative treatments for greater than 12 weeks to includeNSAID's and/or analgesics, corticosteriod injections, viscosupplementation injections and activity modification. Onset of symptoms was gradual, starting 2+ years ago with gradually worsening course since that time. The patient noted no past surgery on the right knee(s). Patient currently rates pain in the right knee(s) at 8 out of 10 with activity. Patient has night pain, worsening of pain with activity and weight bearing, pain that interferes with activities of daily living, pain with passive range of motion, crepitus and joint swelling. Patient has evidence of periarticular osteophytes and joint space narrowing by imaging studies. There is no active infection. Risks, benefits and expectations were discussed with the patient. Risks including but not limited to the risk of anesthesia, blood clots, nerve damage, blood vessel damage, failure of the prosthesis, infection and up to and including death. Patient understand the risks, benefits and expectations and wishes to proceed  with surgery.   PCP: Allean Found, MD   Discharged Condition: good  Hospital Course:  Patient underwent the above stated procedure on 11/03/2013. Patient tolerated the procedure well and brought to the recovery room in good condition and subsequently to the floor.  POD #1 BP: 127/79 ; Pulse: 81 ; Temp: 97.9 F (36.6 C) ; Resp: 17  Patient reports pain as moderate, pain controlled with medication. No events throughout the night. Neurovascular intact, dorsiflexion/plantar flexion intact, incision: dressing C/D/I, no cellulitis present and compartment soft.   LABS  Basename    HGB  9.8  HCT  29.5   POD #2  BP: 140/87 ; Pulse: 91 ; Temp: 98.5 F (36.9 C) ; Resp: 20  Patient reports pain as mild, pain is under better control today. No events throughout the night.  Neurovascular intact, dorsiflexion/plantar flexion intact, incision: dressing C/D/I, no cellulitis present and compartment soft.   LABS  Basename    HGB  9.2  HCT  27.6    Discharge Exam: General appearance: alert, cooperative and no distress Extremities: Homans sign is negative, no sign of DVT, no edema, redness or tenderness in the calves or thighs and no ulcers, gangrene or trophic changes  Disposition:    Skilled Nursing Facility with follow up in 2 weeks   Follow-up Information   Follow up with [name], MD. Schedule an appointment as soon as possible for a visit in 2 weeks.   Specialty:  Orthopedic Surgery   Contact information:   [address] [place] [address] [phone]       Discharge Orders   Future Orders Complete By  Expires   Call MD / Call 911  As directed    Comments:     If you experience chest pain or shortness of breath, CALL 911 and be transported to the hospital emergency room.  If you develope a fever above 101 F, pus (white drainage) or increased drainage or redness at the wound, or calf pain, call your surgeon's office.   Change dressing  As directed      Comments:     Maintain surgical dressing for 10-14 days, or until follow up in the clinic.   Constipation Prevention  As directed    Comments:     Drink plenty of fluids.  Prune juice may be helpful.  You may use a stool softener, such as Colace (over the counter) 100 mg twice a day.  Use MiraLax (over the counter) for constipation as needed.   Diet - low sodium heart healthy  As directed    Discharge instructions  As directed    Comments:     Maintain surgical dressing for 10-14 days, or until follow up in the clinic. Follow up in 2 weeks at [facility]. Call with any questions or concerns.   Driving restrictions  As directed    Comments:     No driving for 4 weeks   Increase activity slowly as tolerated  As directed    TED hose  As directed    Comments:     Use stockings (TED hose) for 2 weeks on both leg(s).  You may remove them at night for sleeping.   Weight bearing as tolerated  As directed    Questions:     Laterality:     Extremity:          Medication List    STOP taking these medications       acetaminophen 500 MG tablet  Commonly known as:  TYLENOL     ciprofloxacin 500 MG tablet  Commonly known as:  CIPRO     HUMIRA [place]     leflunomide 20 MG tablet  Commonly known as:  ARAVA     traMADol 50 MG tablet  Commonly known as:  ULTRAM      TAKE these medications       alendronate 70 MG tablet  Commonly known as:  FOSAMAX  Take 70 mg by mouth once a week. Take with a full glass of water on an empty stomach.     aspirin 325 MG EC tablet  Take 1 tablet (325 mg total) by mouth 2 (two) times daily.     CALCIUM PO  Take 1 tablet by mouth daily.     DSS 100 MG Caps  Take 100 mg by mouth 2 (two) times daily.     ferrous sulfate 325 (65 FE) MG tablet  Take 1 tablet (325 mg total) by mouth 3 (three) times daily after meals.     HYDROcodone-acetaminophen 7.5-325 MG per tablet  Commonly known as:  NORCO  Take 1-2 tablets by mouth every 4 (four)  hours as needed for moderate pain.     lisinopril 10 MG tablet  Commonly known as:  PRINIVIL,ZESTRIL  Take 10 mg by mouth daily with breakfast.     methocarbamol 500 MG tablet  Commonly known as:  ROBAXIN  Take 1 tablet (500 mg total) by mouth every 6 (six) hours as needed for muscle spasms.     polyethylene glycol packet  Commonly known as:  MIRALAX / GLYCOLAX  Take 17 g  by mouth 2 (two) times daily.     predniSONE 5 MG tablet  Commonly known as:  DELTASONE  Take 5 mg by mouth daily with breakfast.         Signed: Anastasio Auerbach. Kiyoshi Schaab   PAC  11/05/2013, 9:37 AM

## 2013-11-06 ENCOUNTER — Other Ambulatory Visit: Payer: Self-pay | Admitting: *Deleted

## 2013-11-06 ENCOUNTER — Non-Acute Institutional Stay (SKILLED_NURSING_FACILITY): Payer: BC Managed Care – PPO | Admitting: Internal Medicine

## 2013-11-06 DIAGNOSIS — D62 Acute posthemorrhagic anemia: Secondary | ICD-10-CM

## 2013-11-06 DIAGNOSIS — IMO0002 Reserved for concepts with insufficient information to code with codable children: Principal | ICD-10-CM

## 2013-11-06 DIAGNOSIS — M1710 Unilateral primary osteoarthritis, unspecified knee: Secondary | ICD-10-CM

## 2013-11-06 DIAGNOSIS — G4700 Insomnia, unspecified: Secondary | ICD-10-CM

## 2013-11-06 DIAGNOSIS — I10 Essential (primary) hypertension: Secondary | ICD-10-CM

## 2013-11-06 MED ORDER — HYDROCODONE-ACETAMINOPHEN 7.5-325 MG PO TABS: ORAL_TABLET | ORAL | Status: DC

## 2013-11-06 MED ORDER — ZOLPIDEM TARTRATE 5 MG PO TABS
ORAL_TABLET | ORAL | Status: DC
Start: 2013-11-06 — End: 2014-08-06

## 2013-11-06 NOTE — Progress Notes (Signed)
Clinical Social Work Department CLINICAL SOCIAL WORK PLACEMENT NOTE 11/06/2013  Patient:  DONNAMARIE, SHANKLES  Account Number:  [id number] Admit date:  11/03/2013  Clinical Social Worker:  Cori Razor, LCSW  Date/time:  11/04/2013 03:22 PM  Clinical Social Work is seeking post-discharge placement for this patient at the following level of care:   SKILLED NURSING   (*CSW will update this form in Epic as items are completed)     Patient/family provided with [facility] Department of Clinical Social Work's list of facilities offering this level of care within the geographic area requested by the patient (or if unable, by the patient's family).  11/04/2013  Patient/family informed of their freedom to choose among providers that offer the needed level of care, that participate in Medicare, Medicaid or managed care program needed by the patient, have an available bed and are willing to accept the patient.    Patient/family informed of [facility]' ownership interest in [facility], as well as of the fact that they are under no obligation to receive care at this facility.  PASARR submitted to EDS on 11/04/2013 PASARR number received from EDS on 11/04/2013  FL2 transmitted to all facilities in geographic area requested by pt/family on  11/04/2013 FL2 transmitted to all facilities within larger geographic area on   Patient informed that his/her managed care company has contracts with or will negotiate with  certain facilities, including the following:     Patient/family informed of bed offers received:  11/04/2013 Patient chooses bed at [facility] Physician recommends and patient chooses bed at    Patient to be transferred to [facility] on  11/06/2013 Patient to be transferred to facility by [facility]  The following physician request were entered in Epic:   Additional Comments:  [employer] provided auth for SNF placement prior to d/c.  Cori Razor LCSW [phone]

## 2013-11-11 ENCOUNTER — Non-Acute Institutional Stay (SKILLED_NURSING_FACILITY): Payer: BC Managed Care – PPO | Admitting: Adult Health

## 2013-11-11 DIAGNOSIS — D62 Acute posthemorrhagic anemia: Secondary | ICD-10-CM

## 2013-11-11 DIAGNOSIS — M1990 Unspecified osteoarthritis, unspecified site: Secondary | ICD-10-CM

## 2013-11-11 DIAGNOSIS — M129 Arthropathy, unspecified: Secondary | ICD-10-CM

## 2013-11-11 DIAGNOSIS — M810 Age-related osteoporosis without current pathological fracture: Secondary | ICD-10-CM

## 2013-11-11 DIAGNOSIS — Z96659 Presence of unspecified artificial knee joint: Secondary | ICD-10-CM

## 2013-11-11 DIAGNOSIS — I10 Essential (primary) hypertension: Secondary | ICD-10-CM

## 2013-11-12 DIAGNOSIS — M069 Rheumatoid arthritis, unspecified: Secondary | ICD-10-CM | POA: Insufficient documentation

## 2013-11-12 DIAGNOSIS — I10 Essential (primary) hypertension: Secondary | ICD-10-CM | POA: Insufficient documentation

## 2013-11-12 DIAGNOSIS — M1990 Unspecified osteoarthritis, unspecified site: Secondary | ICD-10-CM | POA: Insufficient documentation

## 2013-11-12 DIAGNOSIS — M810 Age-related osteoporosis without current pathological fracture: Secondary | ICD-10-CM | POA: Insufficient documentation

## 2013-11-12 NOTE — Progress Notes (Signed)
Patient ID: [name], female   DOB: Aug 28, 1956, 58 y.o.   MRN: [id number]              PROGRESS NOTE  DATE: 11/11/2013   FACILITY: [facility]  LEVEL OF CARE: SNF (31)  Acute Visit  CHIEF COMPLAINT:  Discharge Notes  HISTORY OF PRESENT ILLNESS:This is a 58 year old female who is for discharge home with Home health PT, OT and Nursing. DME: Rolling walker and 3-in-1 commode. She has been admitted to [facility] on 11/05/13 from [facility] with Osteoarthritis S/P right total knee arthroplasty. Patient was admitted to this facility for short-term rehabilitation after the patient's recent hospitalization.  Patient has completed SNF rehabilitation and therapy has cleared the patient for discharge.  Reassessment of ongoing problem(s):  HTN: Pt 's HTN remains stable.  Denies CP, sob, DOE, pedal edema, headaches, dizziness or visual disturbances.  No complications from the medications currently being used.  Last BP :116/75  ANEMIA: The anemia has been stable. The patient denies fatigue, melena or hematochezia. No complications from the medications currently being used. 1/15 hgb 9.2  OSTEOPOROSIS: The patient's osteoporosis remains stable. The patient denies recent worsening of pain and the range of motion remains stable. There has not been any evidence of fractures. No complications noted from the medications presently being used.  PAST MEDICAL HISTORY : Reviewed.  No changes.  CURRENT MEDICATIONS: Reviewed per [facility]  REVIEW OF SYSTEMS:  GENERAL: no change in appetite, no fatigue, no weight changes, no fever, chills or weakness RESPIRATORY: no cough, SOB, DOE, wheezing, hemoptysis CARDIAC: no chest pain, edema or palpitations GI: no abdominal pain, diarrhea, constipation, heart burn, nausea or vomiting  PHYSICAL EXAMINATION  VS:  T98.9       P73       RR18      BP116/75      POX95 %       WT140 (Lb)  GENERAL: no acute distress, normal body  habitus EYES: conjunctivae normal, sclerae normal, normal eye lids NECK: supple, trachea midline, no neck masses, no thyroid tenderness, no thyromegaly LYMPHATICS: no LAN in the neck, no supraclavicular LAN RESPIRATORY: breathing is even & unlabored, BS CTAB CARDIAC: RRR, no murmur,no extra heart sounds, no edema GI: abdomen soft, normal BS, no masses, no tenderness, no hepatomegaly, no splenomegaly PSYCHIATRIC: the patient is alert & oriented to person, affect & behavior appropriate  LABS/RADIOLOGY: Labs reviewed: Basic Metabolic Panel:  Recent Labs  40/81/44 1040 11/04/13 0454 11/05/13 0450  NA 136* 141 144  K 4.5 4.6 4.1  CL 101 107 109  CO2 22 23 26   GLUCOSE 115* 131* 91  BUN 17 16 17   CREATININE 0.78 0.76 0.79  CALCIUM 9.4 8.1* 8.4   CBC:  Recent Labs  10/28/13 1040 11/04/13 0454 11/05/13 0450  WBC 9.1 13.7* 9.9  HGB 13.2 9.8* 9.2*  HCT 40.9 29.5* 27.6*  MCV 90.7 91.0 90.8  PLT 350 274 238    ASSESSMENT/PLAN:  Osteoarthritis S/P Right total knee arthroplasty - for Home health PT, OT and Nursing Osteoporosis - continue Fosamax Hypertension - well-controlled; continue Lisinopril Anemia, acute blood loss - continue ferrous sulfate   I have filled out patient's discharge paperwork and written prescriptions.  Patient will receive home health PT, OT, ST, nursing and CNA. DME provided: Pediatric rolling walker and 3-in-1 commode  Total discharge time: Greater than 30 minutes Discharge time involved coordination of the discharge process with 11/06/13, nursing staff and  therapy department. Medical justification for home health services/DME verified.   CPT CODE: 28768

## 2013-11-14 DIAGNOSIS — IMO0002 Reserved for concepts with insufficient information to code with codable children: Principal | ICD-10-CM

## 2013-11-14 DIAGNOSIS — M1710 Unilateral primary osteoarthritis, unspecified knee: Secondary | ICD-10-CM | POA: Insufficient documentation

## 2013-11-14 DIAGNOSIS — G4700 Insomnia, unspecified: Secondary | ICD-10-CM | POA: Insufficient documentation

## 2013-11-14 NOTE — Progress Notes (Signed)
HISTORY & PHYSICAL  DATE: 11/06/2013   FACILITY: [facility]  LEVEL OF CARE: SNF (31)  ALLERGIES:  Allergies  Allergen Reactions  . Other     clineril caused hepatitis b     CHIEF COMPLAINT:  Manage right knee osteoarthritis, acute blood loss anemia and hypertension  HISTORY OF PRESENT ILLNESS: Patient is a 58 year old Caucasian female  KNEE OSTEOARTHRITIS: Patient had a history of pain and functional disability in the knee due to end-stage osteoarthritis and has failed nonsurgical conservative treatments. Patient had worsening of pain with activity and weight bearing, pain that interfered with activities of daily living & pain with passive range of motion. Therefore patient underwent total knee arthroplasty and tolerated the procedure well. Patient is admitted to this facility for sort short-term rehabilitation. Patient denies knee pain. She had periarticular osteophytes and joint space narrowing by imaging studies.  ANEMIA: The anemia has been stable. The patient denies fatigue, melena or hematochezia. No complications from the medications currently being used. Postoperatively patient suffered acute blood loss. The ostium of the veins of 9.8, 9.2.  HTN: Pt 's HTN remains stable.  Denies CP, sob, DOE, pedal edema, headaches, dizziness or visual disturbances.  No complications from the medications currently being used.  Last BP : 124/87.  PAST MEDICAL HISTORY :  Past Medical History  Diagnosis Date  . Complication of anesthesia   . PONV (postoperative nausea and vomiting)   . Hypertension   . Arthritis   . Arthritis with psoriasis   . Osteoporosis   . Psoriasis   . Bruises easily   . Difficulty sleeping   . History of nonmelanoma skin cancer     PAST SURGICAL HISTORY: Past Surgical History  Procedure Laterality Date  . Back surgery  2006    fusion C2-C6   . Hand surgery      multiple surgeries due to arthritis  . Cesarean section    .  Total knee arthroplasty Right 11/03/2013    Procedure: RIGHT TOTAL KNEE ARTHROPLASTY;  Surgeon: [name], MD;  Location: [facility];  Service: Orthopedics;  Laterality: Right;    SOCIAL HISTORY:  reports that she has never smoked. She does not have any smokeless tobacco history on file. She reports that she does not drink alcohol or use illicit drugs.  FAMILY HISTORY: None  CURRENT MEDICATIONS: Reviewed per [facility]  REVIEW OF SYSTEMS:  Psych: Complains of insomnia, See HPI otherwise 14 point ROS is negative.  PHYSICAL EXAMINATION  VS:  T 96.8        P 96       RR 16       BP 124/87      POX% 97          GENERAL: no acute distress, normal body habitus EYES: conjunctivae normal, sclerae normal, normal eye lids MOUTH/THROAT: lips without lesions,no lesions in the mouth,tongue is without lesions,uvula elevates in midline NECK: supple, trachea midline, no neck masses, no thyroid tenderness, no thyromegaly LYMPHATICS: no LAN in the neck, no supraclavicular LAN RESPIRATORY: breathing is even & unlabored, BS CTAB CARDIAC: RRR, no murmur,no extra heart sounds, +2 right lower extremity edema GI:  ABDOMEN: abdomen soft, normal BS, no masses, no tenderness  LIVER/SPLEEN: no hepatomegaly, no splenomegaly MUSCULOSKELETAL: HEAD: normal to inspection & palpation BACK: no kyphosis, scoliosis or spinal processes tenderness EXTREMITIES: LEFT UPPER EXTREMITY: full range of motion, normal strength & tone RIGHT UPPER EXTREMITY:  full range  of motion, normal strength & tone LEFT LOWER EXTREMITY:  full range of motion, normal strength & tone RIGHT LOWER EXTREMITY:  range of motion not tested due to surgery, normal strength & tone PSYCHIATRIC: the patient is alert & oriented to person, affect & behavior appropriate  LABS/RADIOLOGY:  Labs reviewed: Basic Metabolic Panel:  Recent Labs  10/08/48 1040 11/04/13 0454 11/05/13 0450  NA 136* 141 144  K 4.5 4.6 4.1  CL 101 107 109  CO2 22 23 26     GLUCOSE 115* 131* 91  BUN 17 16 17   CREATININE 0.78 0.76 0.79  CALCIUM 9.4 8.1* 8.4   CBC:  Recent Labs  10/28/13 1040 11/04/13 0454 11/05/13 0450  WBC 9.1 13.7* 9.9  HGB 13.2 9.8* 9.2*  HCT 40.9 29.5* 27.6*  MCV 90.7 91.0 90.8  PLT 350 274 238   CHEST  2 VIEW   COMPARISON:  06/26/2005   FINDINGS: The heart size and mediastinal contours are within normal limits. Both lungs are clear. The visualized skeletal structures are unremarkable. Chronic rotator cuff tear bilaterally.   IMPRESSION: No active cardiopulmonary disease.    ASSESSMENT/PLAN:  Right knee osteoarthritis-status post right total knee arthroplasty Acute blood loss anemia-continue Fe. Recheck. Hypertension-well controlled Insomnia-new problem. Start Ambien 5 mg each bedtime. Constipation-well-controlled Check CBC  I have reviewed patient's medical records received at admission/from hospitalization.  CPT CODE: 11/07/13

## 2014-01-01 ENCOUNTER — Other Ambulatory Visit: Payer: Self-pay | Admitting: Adult Health

## 2014-03-12 ENCOUNTER — Other Ambulatory Visit: Payer: Self-pay | Admitting: Dermatology

## 2014-07-06 ENCOUNTER — Other Ambulatory Visit: Payer: Self-pay | Admitting: Dermatology

## 2014-08-05 ENCOUNTER — Emergency Department (HOSPITAL_BASED_OUTPATIENT_CLINIC_OR_DEPARTMENT_OTHER)
Admission: EM | Admit: 2014-08-05 | Discharge: 2014-08-05 | Disposition: A | Discharge status: Home / Self Care | Payer: BC Managed Care – PPO | Attending: Emergency Medicine | Admitting: Emergency Medicine

## 2014-08-05 ENCOUNTER — Encounter (HOSPITAL_BASED_OUTPATIENT_CLINIC_OR_DEPARTMENT_OTHER): Payer: Self-pay | Admitting: Emergency Medicine

## 2014-08-05 ENCOUNTER — Emergency Department (HOSPITAL_BASED_OUTPATIENT_CLINIC_OR_DEPARTMENT_OTHER): Payer: BC Managed Care – PPO

## 2014-08-05 DIAGNOSIS — Z8669 Personal history of other diseases of the nervous system and sense organs: Secondary | ICD-10-CM | POA: Diagnosis not present

## 2014-08-05 DIAGNOSIS — Z872 Personal history of diseases of the skin and subcutaneous tissue: Secondary | ICD-10-CM | POA: Diagnosis not present

## 2014-08-05 DIAGNOSIS — M1990 Unspecified osteoarthritis, unspecified site: Secondary | ICD-10-CM | POA: Insufficient documentation

## 2014-08-05 DIAGNOSIS — H539 Unspecified visual disturbance: Secondary | ICD-10-CM | POA: Diagnosis not present

## 2014-08-05 DIAGNOSIS — M180 Bilateral primary osteoarthritis of first carpometacarpal joints: Secondary | ICD-10-CM | POA: Diagnosis not present

## 2014-08-05 DIAGNOSIS — Z79899 Other long term (current) drug therapy: Secondary | ICD-10-CM | POA: Diagnosis not present

## 2014-08-05 DIAGNOSIS — R110 Nausea: Secondary | ICD-10-CM | POA: Insufficient documentation

## 2014-08-05 DIAGNOSIS — R51 Headache: Secondary | ICD-10-CM | POA: Diagnosis not present

## 2014-08-05 DIAGNOSIS — Z7952 Long term (current) use of systemic steroids: Secondary | ICD-10-CM | POA: Diagnosis not present

## 2014-08-05 DIAGNOSIS — R42 Dizziness and giddiness: Secondary | ICD-10-CM | POA: Insufficient documentation

## 2014-08-05 DIAGNOSIS — Z85828 Personal history of other malignant neoplasm of skin: Secondary | ICD-10-CM | POA: Diagnosis not present

## 2014-08-05 DIAGNOSIS — R519 Headache, unspecified: Secondary | ICD-10-CM

## 2014-08-05 DIAGNOSIS — I10 Essential (primary) hypertension: Secondary | ICD-10-CM | POA: Diagnosis not present

## 2014-08-05 NOTE — Discharge Instructions (Signed)

## 2014-08-05 NOTE — ED Notes (Signed)
Pt states she fell July18 2015 and hit head. Dx with concussion at that time. Pt c/o on Friday had h/a with nausea and blurred vision. Was sent here by primary MD for CT head.

## 2014-08-06 ENCOUNTER — Encounter: Payer: Self-pay | Admitting: Neurology

## 2014-08-06 ENCOUNTER — Ambulatory Visit (INDEPENDENT_AMBULATORY_CARE_PROVIDER_SITE_OTHER): Payer: BC Managed Care – PPO | Admitting: Neurology

## 2014-08-06 VITALS — BP 122/65 | HR 77 | Ht 61.5 in | Wt 135.0 lb

## 2014-08-06 DIAGNOSIS — G43909 Migraine, unspecified, not intractable, without status migrainosus: Secondary | ICD-10-CM | POA: Insufficient documentation

## 2014-08-06 DIAGNOSIS — M1990 Unspecified osteoarthritis, unspecified site: Secondary | ICD-10-CM

## 2014-08-06 DIAGNOSIS — R51 Headache: Secondary | ICD-10-CM

## 2014-08-06 DIAGNOSIS — M4712 Other spondylosis with myelopathy, cervical region: Secondary | ICD-10-CM | POA: Insufficient documentation

## 2014-08-06 DIAGNOSIS — R519 Headache, unspecified: Secondary | ICD-10-CM | POA: Insufficient documentation

## 2014-08-06 MED ORDER — NORTRIPTYLINE HCL 10 MG PO CAPS: ORAL_CAPSULE | ORAL | Status: DC

## 2014-08-06 MED ORDER — RIZATRIPTAN BENZOATE 5 MG PO TBDP: 5.0000 mg | ORAL_TABLET | ORAL | Status: DC | PRN

## 2014-08-06 NOTE — ED Provider Notes (Signed)
CSN: [id number]     Arrival date & time 08/05/14  0957 History   First MD Initiated Contact with Patient 08/05/14 1013     Chief Complaint  Patient presents with  . Headache     (Consider location/radiation/quality/duration/timing/severity/associated sxs/prior Treatment) HPI  THis is a 58 yo female with history of HTN and osteoporosis who presents with HA.  Patient states that she has had intermittent HA worsening over the last 5 days.  Larey Seat on July  18 and states that she had a full work-up including head imaging.  Patient was diagnosed with a concussion.  Reports worsened HA on Friday with nausea and blurry vision.  Symptoms only last for minutes and then go away but recur.  Denies weakness, numbness or tingling.  No neck pain or fevers.  Currently is asymptomatic.  Called her primary MD today and was referred here for CT scan.  Past Medical History  Diagnosis Date  . Complication of anesthesia   . PONV (postoperative nausea and vomiting)   . Hypertension   . Arthritis   . Arthritis with psoriasis   . Osteoporosis   . Psoriasis   . Bruises easily   . Difficulty sleeping   . History of nonmelanoma skin cancer   . HA (headache)    Past Surgical History  Procedure Laterality Date  . Back surgery  2006    fusion C2-C6   . Hand surgery      multiple surgeries due to arthritis  . Cesarean section    . Total knee arthroplasty Right 11/03/2013    Procedure: RIGHT TOTAL KNEE ARTHROPLASTY;  Surgeon: [name], MD;  Location: [facility];  Service: Orthopedics;  Laterality: Right;   Family History  Problem Relation Age of Onset  . Migraines Mother   . Lupus Sister   . Heart disease Sister    History  Substance Use Topics  . Smoking status: Never Smoker   . Smokeless tobacco: Not on file  . Alcohol Use: No     Comment: occasional   OB History   Grav Para Term Preterm Abortions TAB SAB Ect Mult Living                 Review of Systems  Constitutional: Negative for fever.   Eyes: Positive for visual disturbance.  Respiratory: Negative for chest tightness and shortness of breath.   Cardiovascular: Negative for chest pain.  Gastrointestinal: Positive for nausea. Negative for vomiting and abdominal pain.  Musculoskeletal: Negative for back pain.  Neurological: Positive for light-headedness and headaches. Negative for speech difficulty, weakness and numbness.  Psychiatric/Behavioral: Negative for confusion.  All other systems reviewed and are negative.     Allergies  Other  Home Medications   Prior to Admission medications   Medication Sig Start Date End Date Taking? Authorizing Provider  CALCIUM PO Take 1 tablet by mouth daily.   Yes Historical Provider, MD  leflunomide (ARAVA) 10 MG tablet Take 10 mg by mouth daily.   Yes Historical Provider, MD  lisinopril (PRINIVIL,ZESTRIL) 10 MG tablet Take 10 mg by mouth daily with breakfast.   Yes Historical Provider, MD  predniSONE (DELTASONE) 5 MG tablet Take 5 mg by mouth daily with breakfast.   Yes Historical Provider, MD  traMADol (ULTRAM) 50 MG tablet Take by mouth every 12 (twelve) hours as needed.   Yes Historical Provider, MD  HUMIRA PEN 40 MG/0.8ML PNKT  07/15/14   Historical Provider, MD  nortriptyline (PAMELOR) 10 MG capsule One tablet  every night for one week, then 2 tablets every night 08/06/14   Levert Feinstein, MD  rizatriptan (MAXALT-MLT) 5 MG disintegrating tablet Take 1 tablet (5 mg total) by mouth as needed. May repeat in 2 hours if needed 08/06/14   Levert Feinstein, MD   BP 126/91  Pulse 78  Temp(Src) 98.1 F (36.7 C) (Oral)  Resp 16  Ht 5' 1.75" (1.568 m)  Wt 130 lb (58.968 kg)  BMI 23.98 kg/m2  SpO2 98% Physical Exam  Nursing note and vitals reviewed. Constitutional: She is oriented to person, place, and time. She appears well-developed and well-nourished. No distress.  HENT:  Head: Normocephalic and atraumatic.  Eyes: Pupils are equal, round, and reactive to light.  Neck: Normal range of  motion. Neck supple.  Cardiovascular: Normal rate, regular rhythm and normal heart sounds.   Pulmonary/Chest: Effort normal. No respiratory distress.  Abdominal: Soft.  Neurological: She is alert and oriented to person, place, and time.  No dysmetria to finger nose finger, normal gait, 5/5 strength in all 4 extremities  Skin: Skin is warm and dry.  Psychiatric: She has a normal mood and affect.    ED Course  Procedures (including critical care time) Labs Review Labs Reviewed - No data to display  Imaging Review Ct Head Wo Contrast  08/05/2014   CLINICAL DATA:  Fall hitting head on 05/02/2014 with concussion diagnosed. Headache with nausea and blurred vision 5 days ago.  EXAM: CT HEAD WITHOUT CONTRAST  TECHNIQUE: Contiguous axial images were obtained from the base of the skull through the vertex without intravenous contrast.  COMPARISON:  None.  FINDINGS: There is no evidence of acute cortical infarct, intracranial hemorrhage, mass, midline shift, or extra-axial fluid collection. Ventricles and sulci are normal for age. Subcentimeter focus mild hypoattenuation versus artifact in the left caudate head. Gray-white differentiation is preserved.  Orbits are unremarkable. Limited visualization of the upper cervical spine again demonstrates deficiency of the dens with a chronic sclerotic changes at C1-2. There is suggestion of a slightly more well-defined curvilinear lucency through the right aspect of C1 to, however this is incompletely evaluated. Mastoid air cells and visualized paranasal sinuses are clear.  IMPRESSION: 1. No definite evidence of acute intracranial abnormality. 2. Tiny focus of artifact versus age-indeterminate lacunar infarct in the left caudate head. 3. Chronic degenerative and/or posttraumatic changes partially visualized in the upper cervical spine. There is suggestion of some interval change at C1-2 on the right, however this is incompletely evaluated. This may reflect progressive  degenerative change, however if there is any concern for acute/ subacute cervical spine injury, cervical spine CT is recommended.   Electronically Signed   By: Sebastian Ache   On: 08/05/2014 11:05     EKG Interpretation None      MDM   Final diagnoses:  Nonintractable episodic headache, unspecified headache type    Patient presents with intermittent HA with blurred vision since being diagnosed with a concussion in July.  CUrrent symptom free but referred for CT scan.  Nonfocal on exam.  Discussed with patient that I felt CT would be low yield but given PMD's concerns will obtain.  Patient needs outpatient neurology follow-up for continued concussive symptoms and headaches.  CT with artifact vs small lacunar infarct?  GIven that patient is nonfocal and infarct would not likely cause HA, suspect artifact.  Discussed results with patient.  WIll refer to neurology.   After history, exam, and medical workup I feel the patient has been appropriately medically  screened and is safe for discharge home. Pertinent diagnoses were discussed with the patient. Patient was given return precautions.     Shon Baton, MD 08/06/14 2031

## 2014-08-06 NOTE — Progress Notes (Signed)
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 Merri Brunette for evaluation of headaches.  She has PMHx of cervical fusion, psoriasis arthritis, cervical fusion for cervical myelopathy, prior to the surgery,she has right side neck pain, gait difficulty, now she noted limited range of motion. Multiple left hand surgery due to arthritis, right knee replacement.  She denies a history of headaches, in July 18th 2015, she tripped and fell to her left frontal area, has to have stitches, since then, she has frequent headaches, starting from right neck, spreading forward, pounding moderate.  In Oct 16th 2015, she experience sudden onset severe pounding headaches with blurry vision, light noise sensitivity, movement made it worse, relieved by Tylenol and tramadol and sleep few hours later.  She experienced similar headaches in oct 18th,20th,   Had CT head with out contrast by Dr. Katrinka Blazing, No acute intracranial abnormality. Tiny focus of artifact versus age-indeterminate lacunar infarct in the left caudate head.  Chronic degenerative and/or posttraumatic changes partially visualized in the upper cervical spine. There is suggestion of some interval change at C1-2 on the right, however this is incompletely  evaluated. This may reflect progressive degenerative change,    She has no history of migraine REVIEW OF SYSTEMS: Full 14 system review of systems performed and notable only for fatigue, blurry vision, dizziness,headaches,insominia  ALLERGIES: Allergies  Allergen Reactions  . Other     clineril caused hepatitis b     HOME MEDICATIONS: Current Outpatient Prescriptions on File Prior to Visit  Medication Sig Dispense Refill  . CALCIUM PO Take 1 tablet by mouth daily.      Marland Kitchen leflunomide (ARAVA) 10 MG tablet Take 10 mg by mouth daily.      Marland Kitchen lisinopril (PRINIVIL,ZESTRIL) 10 MG tablet Take 10 mg by mouth daily with breakfast.       . predniSONE (DELTASONE) 5 MG tablet Take 5 mg by mouth daily with breakfast.      . traMADol (ULTRAM) 50 MG tablet Take by mouth every 12 (twelve) hours as needed.       No current facility-administered medications on file prior to visit.    PAST MEDICAL HISTORY: Past Medical History  Diagnosis Date  . Complication of anesthesia   . PONV (postoperative nausea and vomiting)   . Hypertension   . Arthritis   . Arthritis with psoriasis   . Osteoporosis   . Psoriasis   . Bruises easily   . Difficulty sleeping   . History of nonmelanoma skin cancer   . HA (headache)     PAST SURGICAL HISTORY: Past Surgical History  Procedure Laterality Date  . Back surgery  2006    fusion C2-C6   . Hand surgery      multiple surgeries due to arthritis  . Cesarean section    . Total knee arthroplasty Right 11/03/2013    Procedure: RIGHT TOTAL KNEE ARTHROPLASTY;  Surgeon: [name], MD;  Location: [facility];  Service: Orthopedics;  Laterality: Right;    FAMILY HISTORY: No family history on file.  SOCIAL HISTORY:  History   Social History  . Marital Status: Married    Spouse Name: Theron Arista     Number of Children: 2  . Years of Education: college   Occupational History  .     Social History Main Topics  . Smoking status: Never Smoker   . Smokeless tobacco: Not on file  .  Alcohol Use: No     Comment: occasional  . Drug Use: No  . Sexual Activity: Not on file   Other Topics Concern  . Not on file   Social History Narrative   Patient lives at home with her husband Theron Arista). Patient is a Agricultural consultant . Retired.   Education college.   Right handed.   Caffeine 1-2 cups of caffeine daily.     PHYSICAL EXAM   Filed Vitals:   08/06/14 1521  BP: 122/65  Pulse: 77  Height: 5' 1.5" (1.562 m)  Weight: 135 lb (61.236 kg)    Not recorded    Body mass index is 25.1 kg/(m^2).   Generalized: In no acute distress  Neck: Supple, no carotid bruits   Cardiac: Regular rate  rhythm  Pulmonary: Clear to auscultation bilaterally  Musculoskeletal: No deformity  Neurological examination  Mentation: Alert oriented to time, place, history taking, and causual conversation, limited range of motion of her neck muscles.  Cranial nerve II-XII: Pupils were equal round reactive to light. Extraocular movements were full.  Visual field were full on confrontational test. Bilateral fundi were sharp.  Facial sensation and strength were normal. Hearing was intact to finger rubbing bilaterally. Uvula tongue midline.  Head turning and shoulder shrug and were normal and symmetric.Tongue protrusion into cheek strength was normal.  Motor: Normal tone, bulk and strength, deformity of left hand joints..  Sensory: Intact to fine touch, pinprick, preserved vibratory sensation, and proprioception at toes.  Coordination: Normal finger to nose, heel-to-shin bilaterally there was no truncal ataxia  Gait: Rising up from seated position without assistance, normal stance, without trunk ataxia, moderate stride, good arm swing, smooth turning, able to perform tiptoe, and heel walking without difficulty.   Romberg signs: Negative  Deep tendon reflexes: Brachioradialis 2/2, biceps 2/2, triceps 2/2, patellar 2/2, Achilles 2/2, plantar responses were flexor bilaterally.   DIAGNOSTIC DATA (LABS, IMAGING, TESTING) - I reviewed patient records, labs, notes, testing and imaging myself where available.  Lab Results  Component Value Date   WBC 9.9 11/05/2013   HGB 9.2* 11/05/2013   HCT 27.6* 11/05/2013   MCV 90.8 11/05/2013   PLT 238 11/05/2013      Component Value Date/Time   NA 144 11/05/2013 0450   K 4.1 11/05/2013 0450   CL 109 11/05/2013 0450   CO2 26 11/05/2013 0450   GLUCOSE 91 11/05/2013 0450   BUN 17 11/05/2013 0450   CREATININE 0.79 11/05/2013 0450   CALCIUM 8.4 11/05/2013 0450   GFRNONAA >90 11/05/2013 0450   GFRAA >90 11/05/2013 0450    ASSESSMENT AND PLAN  [name] is a 58  y.o. female complains of headache since she tripped and fell in July, more severe headache with migraine features recent few weeks,  1. DDx. Post traumatic migraine,  2. MRI brain to further evaluation reported lacuna infarction on CT head, new onset headaches. 3. MRI cervical for increased right side neck pain, rule out cervical myelopathy. 4. Add on Nortriptyline 10mg  qhs, maxalt as needed.  , M.D. Ph.D.  [facility] [address], [address] [place], [place] [phone]

## 2014-08-09 ENCOUNTER — Encounter: Payer: Self-pay | Admitting: Neurology

## 2014-08-12 ENCOUNTER — Ambulatory Visit (INDEPENDENT_AMBULATORY_CARE_PROVIDER_SITE_OTHER): Payer: BC Managed Care – PPO

## 2014-08-12 DIAGNOSIS — M4712 Other spondylosis with myelopathy, cervical region: Secondary | ICD-10-CM

## 2014-08-12 DIAGNOSIS — M1990 Unspecified osteoarthritis, unspecified site: Secondary | ICD-10-CM

## 2014-08-12 DIAGNOSIS — G43909 Migraine, unspecified, not intractable, without status migrainosus: Secondary | ICD-10-CM

## 2014-08-13 ENCOUNTER — Telehealth: Payer: Self-pay | Admitting: Neurology

## 2014-08-13 NOTE — Telephone Encounter (Signed)
I have called her,   MRI brain was normal.   MRI cervical spine (without) demonstrating:  1. At C7-T1: disc bulging with mild spinal stenosis, severe right and moderate left foraminal stenosis. No cord signal abnormalities.  2. At T1-2: disc bulging with severe biforaminal stenosis.  3. At T2-3: disc bulging with moderate right and mild left foraminal stenosis.  4. Posterior decompression at C5 and C6; metal hardware from C3 to C7. There is adjacent myelomalacia / gliosis at C5 (stable from 2011).  5. Compared to MRI on 05/13/10, there is worsening foraminal stenosis at C7-T1 and T2-3 and worsening spinal stenosis at C7-T1.   Keep follow up in Nov 9th

## 2014-08-17 NOTE — Progress Notes (Signed)
Quick Note:  Called and spoke patient normal MRI. Patient understood. ______

## 2014-08-24 ENCOUNTER — Ambulatory Visit (INDEPENDENT_AMBULATORY_CARE_PROVIDER_SITE_OTHER): Payer: BC Managed Care – PPO | Admitting: Neurology

## 2014-08-24 ENCOUNTER — Encounter: Payer: Self-pay | Admitting: Neurology

## 2014-08-24 VITALS — BP 136/92 | HR 101 | Ht 61.5 in | Wt 138.0 lb

## 2014-08-24 DIAGNOSIS — M4712 Other spondylosis with myelopathy, cervical region: Secondary | ICD-10-CM

## 2014-08-24 DIAGNOSIS — G43909 Migraine, unspecified, not intractable, without status migrainosus: Secondary | ICD-10-CM

## 2014-08-24 MED ORDER — NORTRIPTYLINE HCL 10 MG PO CAPS: 20.0000 mg | ORAL_CAPSULE | Freq: Every day | ORAL | Status: DC

## 2014-08-24 NOTE — Progress Notes (Signed)
PATIENT: [name] DOB: 04/15/1956  HISTORICAL  [name] is a 58 female referred by her  PCP Dr. Merri Brunette for evaluation of headaches.  She has PMHx of cervical fusion, psoriasis arthritis, cervical fusion for cervical myelopathy, prior to the surgery,she has right side neck pain, gait difficulty, now she noted limited range of motion. Multiple left hand surgery due to arthritis, right knee replacement.  She denies a history of headaches, in July 18th 2015, she tripped and fell to her left frontal area, has to have stitches, since then, she has frequent headaches, starting from right neck, spreading forward, pounding moderate.  In Oct 16th 2015, she experience sudden onset severe pounding headaches with blurry vision, light noise sensitivity, movement made it worse, relieved by Tylenol and tramadol and sleep few hours later.  She experienced similar headaches in oct 18th,20th,   Had CT head with out contrast by Dr. Katrinka Blazing, No acute intracranial abnormality. Tiny focus of artifact versus age-indeterminate lacunar infarct in the left caudate head.  Chronic degenerative and/or posttraumatic changes partially visualized in the upper cervical spine. There is suggestion of some interval change at C1-2 on the right, however this is incompletely  evaluated. This may reflect progressive degenerative change,    She has no history of migraine  UPDATE Nov 9th 2015: She was started on nortriptyline, titrating to 10 mg 2 tablets every night since last visit in October 22nd 2015,She has much improved, no longer has headaches, tried Maxalt one time for right neck pain, which did not help much,  She continued to have intermittent right side neck pain, radiating to right shoulder, but denies right arm persistent weakness, or sensory loss, she has mild baseline gait difficulty due to deformity of bilateral feet, no significant worsening, she denies bowel and bladder  incontinence.  We have reviewed MRI together, MRI of the brain was normal MRI cervical spine (without) demonstrating: 1. At C7-T1: disc bulging with mild spinal stenosis, severe right and moderate left foraminal stenosis. No cord signal abnormalities. 2. At T1-2: disc bulging with severe biforaminal stenosis. 3. At T2-3: disc bulging with moderate right and mild left foraminal stenosis. 4. Posterior decompression at C5 and C6; metal hardware from C3 to C7. There is adjacent myelomalacia / gliosis at C5 (stable from 2011). 5. Compared to MRI on 05/13/10, there is worsening foraminal stenosis at C7-T1 and T2-3 and worsening spinal stenosis at C7-T1.    REVIEW OF SYSTEMS: Full 14 system review of systems performed and notable only for fatigue, blurry vision, dizziness,headaches,insominia  ALLERGIES: Allergies  Allergen Reactions  . Other     clineril caused hepatitis b     HOME MEDICATIONS: Current Outpatient Prescriptions on File Prior to Visit  Medication Sig Dispense Refill  . CALCIUM PO Take 1 tablet by mouth daily.    Marland Kitchen HUMIRA PEN 40 MG/0.8ML PNKT     . leflunomide (ARAVA) 10 MG tablet Take 10 mg by mouth daily.    Marland Kitchen lisinopril (PRINIVIL,ZESTRIL) 10 MG tablet Take 10 mg by mouth daily with breakfast.    . nortriptyline (PAMELOR) 10 MG capsule One tablet every night for one week, then 2 tablets every night 60 capsule 6  . predniSONE (DELTASONE) 5 MG tablet Take 5 mg by mouth daily with breakfast.    . rizatriptan (MAXALT-MLT) 5 MG disintegrating tablet Take 1 tablet (5 mg total) by mouth as needed. May repeat in 2 hours if needed 15 tablet 6  . traMADol (ULTRAM)  50 MG tablet Take by mouth every 12 (twelve) hours as needed.     No current facility-administered medications on file prior to visit.    PAST MEDICAL HISTORY: Past Medical History  Diagnosis Date  . Complication of anesthesia   . PONV (postoperative nausea and vomiting)   . Hypertension   . Arthritis   . Arthritis  with psoriasis   . Osteoporosis   . Psoriasis   . Bruises easily   . Difficulty sleeping   . History of nonmelanoma skin cancer   . HA (headache)     PAST SURGICAL HISTORY: Past Surgical History  Procedure Laterality Date  . Back surgery  2006    fusion C2-C6   . Hand surgery      multiple surgeries due to arthritis  . Cesarean section    . Total knee arthroplasty Right 11/03/2013    Procedure: RIGHT TOTAL KNEE ARTHROPLASTY;  Surgeon: [name], MD;  Location: [facility];  Service: Orthopedics;  Laterality: Right;    FAMILY HISTORY: Family History  Problem Relation Age of Onset  . Migraines Mother   . Lupus Sister   . Heart disease Sister     SOCIAL HISTORY:  History   Social History  . Marital Status: Married    Spouse Name: Theron Arista     Number of Children: 2  . Years of Education: college   Occupational History  .     Social History Main Topics  . Smoking status: Never Smoker   . Smokeless tobacco: Never Used  . Alcohol Use: No     Comment: occasional  . Drug Use: No  . Sexual Activity: Not on file   Other Topics Concern  . Not on file   Social History Narrative   Patient lives at home with her husband Theron Arista). Patient is a Agricultural consultant . Retired.   Education college.   Right handed.   Caffeine 1-2 cups of caffeine daily.     PHYSICAL EXAM   Filed Vitals:   08/24/14 1049  BP: 136/92  Pulse: 101  Height: 5' 1.5" (1.562 m)  Weight: 138 lb (62.596 kg)    Not recorded      Body mass index is 25.66 kg/(m^2).   Generalized: In no acute distress  Neck: Supple, no carotid bruits   Cardiac: Regular rate rhythm  Pulmonary: Clear to auscultation bilaterally  Musculoskeletal: No deformity  Neurological examination  Mentation: Alert oriented to time, place, history taking, and causual conversation, limited range of motion of her neck muscles.  Cranial nerve II-XII: Pupils were equal round reactive to light. Extraocular movements were full.   Visual field were full on confrontational test. Bilateral fundi were sharp.  Facial sensation and strength were normal. Hearing was intact to finger rubbing bilaterally. Uvula tongue midline.  Head turning and shoulder shrug and were normal and symmetric.Tongue protrusion into cheek strength was normal.  Motor: Normal tone, bulk and strength, deformity of bilateral hand and feet joints Sensory: Intact to fine touch, pinprick, preserved vibratory sensation, and proprioception at toes.  Coordination: Normal finger to nose, heel-to-shin bilaterally there was no truncal ataxia  Gait: Rising up from seated position by pushing on a chair arm, cautious, unsteady, wide-based gait. Romberg signs: Negative  Deep tendon reflexes: Brachioradialis 2/2, biceps 2/2, triceps 2/2, patellar 2/2, Achilles trace2/2, plantar responses were flexor bilaterally.   DIAGNOSTIC DATA (LABS, IMAGING, TESTING) - I reviewed patient records, labs, notes, testing and imaging myself where available.  Lab Results  Component Value Date   WBC 9.9 11/05/2013   HGB 9.2* 11/05/2013   HCT 27.6* 11/05/2013   MCV 90.8 11/05/2013   PLT 238 11/05/2013      Component Value Date/Time   NA 144 11/05/2013 0450   K 4.1 11/05/2013 0450   CL 109 11/05/2013 0450   CO2 26 11/05/2013 0450   GLUCOSE 91 11/05/2013 0450   BUN 17 11/05/2013 0450   CREATININE 0.79 11/05/2013 0450   CALCIUM 8.4 11/05/2013 0450   GFRNONAA >90 11/05/2013 0450   GFRAA >90 11/05/2013 0450    ASSESSMENT AND PLAN  [name] is a 58 y.o. female complains of headache since she tripped and fell in July, more severe headache with migraine features in recent few weeks,  Her headache has much improved, after taking nortriptyline now 10 mg 2 tablets every night, we have reviewed MRI of the brain, that was normal, MRI of the cervical spine, C7-T1: disc bulging with mild spinal stenosis, severe right and moderate left foraminal stenosis. No cord signal  abnormalities. T1-2: disc bulging with severe biforaminal stenosis. T2-3: disc bulging with moderate right and mild left foraminal stenosis. Posterior decompression at C5 and C6; metal hardware from C3 to C7. There is adjacent myelomalacia / gliosis at C5 (stable from 2011). Compared to MRI on 05/13/10, there is worsening foraminal stenosis at C7-T1 and T2-3 and worsening spinal stenosis at C7-T1  1. DDx. Post traumatic migraine, much improved, with nortriptyline, 2. Chronic cervical degenerative disease, multilevel foraminal stenosis, detailed above, continue conservative treatment, no evidence of active cervical myelopathy 3. Return to clinic as needed    Levert Feinstein, M.D. Ph.D.  [facility] [address], [address] [place], [place] [address] [phone]

## 2014-12-21 ENCOUNTER — Other Ambulatory Visit: Payer: Self-pay | Admitting: Dermatology

## 2015-10-20 NOTE — H&P (Signed)
TOTAL KNEE ADMISSION H&P  Patient is being admitted for left total knee arthroplasty.  Subjective:  Chief Complaint:   Left knee primary OA / pain  HPI: [name], 60 y.o. female, has a history of pain and functional disability in the left knee due to arthritis and has failed non-surgical conservative treatments for greater than 12 weeks to include NSAID's and/or analgesics and activity modification.  Onset of symptoms was gradual, starting 3+ years ago with gradually worsening course since that time. The patient noted prior procedures on the knee to include  arthroplasty on the right knee per Dr. Charlann Boxer on 10/30/2014.  Patient currently rates pain in the left knee(s) at 9 out of 10 with activity. Patient has worsening of pain with activity and weight bearing, pain that interferes with activities of daily living, pain with passive range of motion, crepitus and joint swelling.  Patient has evidence of periarticular osteophytes and joint space narrowing by imaging studies.  There is no active infection.   Risks, benefits and expectations were discussed with the patient.  Risks including but not limited to the risk of anesthesia, blood clots, nerve damage, blood vessel damage, failure of the prosthesis, infection and up to and including death.  Patient understand the risks, benefits and expectations and wishes to proceed with surgery.   PCP: Allean Found, MD  D/C Plans:      Home with HHPT  Post-op Meds:       No Rx given   Tranexamic Acid:      To be given - IV   Decadron:      Is to be given  FYI:     ASA   Oxycodone    Patient Active Problem List   Diagnosis Date Noted  . Cervical spondylosis with myelopathy 08/06/2014  . Migraine 08/06/2014  . HA (headache)   . Unspecified arthropathy, lower leg 11/14/2013  . Insomnia, unspecified 11/14/2013  . Osteoporosis 11/12/2013  . Hypertension 11/12/2013  . Arthritis 11/12/2013  . Expected blood loss anemia 11/05/2013  . S/P  right TKA 11/03/2013   Past Medical History  Diagnosis Date  . Complication of anesthesia   . PONV (postoperative nausea and vomiting)   . Hypertension   . Arthritis   . Arthritis with psoriasis   . Osteoporosis   . Psoriasis   . Bruises easily   . Difficulty sleeping   . History of nonmelanoma skin cancer   . HA (headache)     Past Surgical History  Procedure Laterality Date  . Back surgery  2006    fusion C2-C6   . Hand surgery      multiple surgeries due to arthritis  . Cesarean section    . Total knee arthroplasty Right 11/03/2013    Procedure: RIGHT TOTAL KNEE ARTHROPLASTY;  Surgeon: [name], MD;  Location: [facility];  Service: Orthopedics;  Laterality: Right;    No prescriptions prior to admission   Allergies  Allergen Reactions  . Other     clineril caused hepatitis b     Social History  Substance Use Topics  . Smoking status: Never Smoker   . Smokeless tobacco: Never Used  . Alcohol Use: No     Comment: occasional    Family History  Problem Relation Age of Onset  . Migraines Mother   . Lupus Sister   . Heart disease Sister      Review of Systems  Constitutional: Negative.   Eyes: Negative.   Respiratory: Negative.  Cardiovascular: Negative.   Gastrointestinal: Negative.   Genitourinary: Negative.   Musculoskeletal: Positive for joint pain.  Skin: Negative.   Neurological: Positive for headaches.  Endo/Heme/Allergies: Negative.   Psychiatric/Behavioral: The patient has insomnia.     Objective:  Physical Exam  Constitutional: She is oriented to person, place, and time. She appears well-developed.  HENT:  Head: Normocephalic.  Eyes: Pupils are equal, round, and reactive to light.  Neck: Neck supple. No JVD present. No tracheal deviation present. No thyromegaly present.  Cardiovascular: Normal rate, regular rhythm, normal heart sounds and intact distal pulses.   Respiratory: Effort normal and breath sounds normal. No stridor. No  respiratory distress. She has no wheezes.  GI: Soft. There is no tenderness. There is no guarding.  Musculoskeletal:       Left knee: She exhibits decreased range of motion, swelling and bony tenderness. She exhibits no ecchymosis, no deformity, no laceration and no erythema. Tenderness found.  Lymphadenopathy:    She has no cervical adenopathy.  Neurological: She is alert and oriented to person, place, and time.  Skin: Skin is warm and dry.  Psychiatric: She has a normal mood and affect.       Labs:  Estimated body mass index is 25.66 kg/(m^2) as calculated from the following:   Height as of 08/24/14: 5' 1.5" (1.562 m).   Weight as of 08/24/14: 62.596 kg (138 lb).   Imaging Review Plain radiographs demonstrate severe degenerative joint disease of the left knee(s). The overall alignment is neutral. The bone quality appears to be good for age and reported activity level.  Assessment/Plan:  End stage arthritis, left knee   The patient history, physical examination, clinical judgment of the provider and imaging studies are consistent with end stage degenerative joint disease of the left knee(s) and total knee arthroplasty is deemed medically necessary. The treatment options including medical management, injection therapy arthroscopy and arthroplasty were discussed at length. The risks and benefits of total knee arthroplasty were presented and reviewed. The risks due to aseptic loosening, infection, stiffness, patella tracking problems, thromboembolic complications and other imponderables were discussed. The patient acknowledged the explanation, agreed to proceed with the plan and consent was signed. Patient is being admitted for inpatient treatment for surgery, pain control, PT, OT, prophylactic antibiotics, VTE prophylaxis, progressive ambulation and ADL's and discharge planning. The patient is planning to be discharged home with home health services.       Anastasio Auerbach Avril Busser    PA-C  10/20/2015, 5:52 PM

## 2015-10-26 ENCOUNTER — Encounter (HOSPITAL_COMMUNITY)
Admission: RE | Admit: 2015-10-26 | Discharge: 2015-10-26 | Disposition: A | Discharge status: Home / Self Care | Payer: BLUE CROSS/BLUE SHIELD | Source: Ambulatory Visit | Attending: Orthopedic Surgery | Admitting: Orthopedic Surgery

## 2015-10-26 ENCOUNTER — Encounter (HOSPITAL_COMMUNITY): Payer: Self-pay

## 2015-10-26 DIAGNOSIS — Z01812 Encounter for preprocedural laboratory examination: Secondary | ICD-10-CM | POA: Insufficient documentation

## 2015-10-26 HISTORY — DX: Personal history of urinary (tract) infections: Z87.440

## 2015-10-26 HISTORY — DX: Cardiomegaly: I51.7

## 2015-10-26 HISTORY — DX: Unspecified fall, initial encounter: W19.XXXA

## 2015-10-26 HISTORY — DX: Concussion with loss of consciousness status unknown, initial encounter: S06.0XAA

## 2015-10-26 HISTORY — DX: Concussion with loss of consciousness of unspecified duration, initial encounter: S06.0X9A

## 2015-10-26 HISTORY — DX: Reserved for concepts with insufficient information to code with codable children: IMO0002

## 2015-10-26 HISTORY — DX: Presence of spectacles and contact lenses: Z97.3

## 2015-10-26 HISTORY — DX: Personal history of other diseases of the respiratory system: Z87.09

## 2015-10-26 LAB — URINE MICROSCOPIC-ADD ON

## 2015-10-26 LAB — BASIC METABOLIC PANEL
Anion gap: 10 (ref 5–15)
BUN: 16 mg/dL (ref 6–20)
CHLORIDE: 105 mmol/L (ref 101–111)
CO2: 26 mmol/L (ref 22–32)
CREATININE: 0.87 mg/dL (ref 0.44–1.00)
Calcium: 10 mg/dL (ref 8.9–10.3)
GFR calc Af Amer: >60 mL/min (ref >60)
GFR calc non Af Amer: >60 mL/min (ref >60)
GLUCOSE: 102 mg/dL — AB (ref 65–99)
Potassium: 5.1 mmol/L (ref 3.5–5.1)
SODIUM: 141 mmol/L (ref 135–145)

## 2015-10-26 LAB — URINALYSIS, ROUTINE W REFLEX MICROSCOPIC
Bilirubin Urine: NEGATIVE
GLUCOSE, UA: NEGATIVE mg/dL
Ketones, ur: NEGATIVE mg/dL
NITRITE: NEGATIVE
PH: 6 (ref 5.0–8.0)
Protein, ur: NEGATIVE mg/dL
SPECIFIC GRAVITY, URINE: 1.012 (ref 1.005–1.030)

## 2015-10-26 LAB — CBC
HCT: 45.2 % (ref 36.0–46.0)
Hemoglobin: 14.3 g/dL (ref 12.0–15.0)
MCH: 29.5 pg (ref 26.0–34.0)
MCHC: 31.6 g/dL (ref 30.0–36.0)
MCV: 93.2 fL (ref 78.0–100.0)
PLATELETS: 327 10*3/uL (ref 150–400)
RBC: 4.85 MIL/uL (ref 3.87–5.11)
RDW: 13.2 % (ref 11.5–15.5)
WBC: 8 10*3/uL (ref 4.0–10.5)

## 2015-10-26 LAB — SURGICAL PCR SCREEN
MRSA, PCR: NEGATIVE
Staphylococcus aureus: POSITIVE — AB

## 2015-10-26 LAB — TYPE AND SCREEN
ABO/RH(D): O POS
Antibody Screen: NEGATIVE

## 2015-10-26 LAB — PROTIME-INR
INR: 0.99 (ref 0.00–1.49)
Prothrombin Time: 13.3 seconds (ref 11.6–15.2)

## 2015-10-26 LAB — APTT: aPTT: 28 seconds (ref 24–37)

## 2015-10-26 NOTE — Patient Instructions (Signed)
[name]  10/26/2015   Your procedure is scheduled on: Monday November 01, 2015  Report to [facility] Main  Entrance take [place]  elevators to 3rd floor to  Short Stay Center at 12:45 PM.  Call this number if you have problems the morning of surgery [phone]   Remember: ONLY 1 PERSON MAY GO WITH YOU TO SHORT STAY TO GET  READY MORNING OF YOUR SURGERY.  Do not eat foodAfter Midnight but may take clear liquids till 8:45 am day of surgery then nothing by mouth.      Take these medicines the morning of surgery with A SIP OF WATER: Prednisone  DO NOT TAKE ANY DIABETIC MEDICATIONS DAY OF YOUR SURGERY                               You may not have any metal on your body including hair pins and              piercings  Do not wear jewelry, make-up, lotions, powders or perfumes, deodorant             Do not wear nail polish.  Do not shave  48 hours prior to surgery.               Do not bring valuables to the hospital. [place] IS NOT             RESPONSIBLE   FOR VALUABLES.  Contacts, dentures or bridgework may not be worn into surgery.  Leave suitcase in the car. After surgery it may be brought to your room.                Please read over the following fact sheets you were given:MRSA INFORMATION SHEET; INCENTIVE SPIROMETER; BLOOD TRANSFUSION INFORMATION SHEET  _____________________________________________________________________             [facility] - Preparing for Surgery Before surgery, you can play an important role.  Because skin is not sterile, your skin needs to be as free of germs as possible.  You can reduce the number of germs on your skin by washing with CHG (chlorahexidine gluconate) soap before surgery.  CHG is an antiseptic cleaner which kills germs and bonds with the skin to continue killing germs even after washing. Please DO NOT use if you have an allergy to CHG or antibacterial soaps.  If your skin becomes reddened/irritated stop  using the CHG and inform your nurse when you arrive at Short Stay. Do not shave (including legs and underarms) for at least 48 hours prior to the first CHG shower.  You may shave your face/neck. Please follow these instructions carefully:  1.  Shower with CHG Soap the night before surgery and the  morning of Surgery.  2.  If you choose to wash your hair, wash your hair first as usual with your  normal  shampoo.  3.  After you shampoo, rinse your hair and body thoroughly to remove the  shampoo.                           4.  Use CHG as you would any other liquid soap.  You can apply chg directly  to the skin and wash  Gently with a scrungie or clean washcloth.  5.  Apply the CHG Soap to your body ONLY FROM THE NECK DOWN.   Do not use on face/ open                           Wound or open sores. Avoid contact with eyes, ears mouth and genitals (private parts).                       Wash face,  Genitals (private parts) with your normal soap.             6.  Wash thoroughly, paying special attention to the area where your surgery  will be performed.  7.  Thoroughly rinse your body with warm water from the neck down.  8.  DO NOT shower/wash with your normal soap after using and rinsing off  the CHG Soap.                9.  Pat yourself dry with a clean towel.            10.  Wear clean pajamas.            11.  Place clean sheets on your bed the night of your first shower and do not  sleep with pets. Day of Surgery : Do not apply any lotions/deodorants the morning of surgery.  Please wear clean clothes to the hospital/surgery center.  FAILURE TO FOLLOW THESE INSTRUCTIONS MAY RESULT IN THE CANCELLATION OF YOUR SURGERY PATIENT SIGNATURE_________________________________  NURSE SIGNATURE__________________________________  ________________________________________________________________________    CLEAR LIQUID DIET   Foods Allowed                                                                      Foods Excluded  Coffee and tea, regular and decaf                             liquids that you cannot  Plain Jell-O in any flavor                                             see through such as: Fruit ices (not with fruit pulp)                                     milk, soups, orange juice  Iced Popsicles                                    All solid food Carbonated beverages, regular and diet                                    Cranberry, grape and apple juices Sports drinks like Gatorade Lightly seasoned clear broth or consume(fat free) Sugar, honey syrup  Sample Menu Breakfast                                Lunch                                     Supper Cranberry juice                    Beef broth                            Chicken broth Jell-O                                     Grape juice                           Apple juice Coffee or tea                        Jell-O                                      Popsicle                                                Coffee or tea                        Coffee or tea  _____________________________________________________________________    Incentive Spirometer  An incentive spirometer is a tool that can help keep your lungs clear and active. This tool measures how well you are filling your lungs with each breath. Taking long deep breaths may help reverse or decrease the chance of developing breathing (pulmonary) problems (especially infection) following:  A long period of time when you are unable to move or be active. BEFORE THE PROCEDURE   If the spirometer includes an indicator to show your best effort, your nurse or respiratory therapist will set it to a desired goal.  If possible, sit up straight or lean slightly forward. Try not to slouch.  Hold the incentive spirometer in an upright position. INSTRUCTIONS FOR USE   Sit on the edge of your bed if possible, or sit up as far as you can in bed or on a  chair.  Hold the incentive spirometer in an upright position.  Breathe out normally.  Place the mouthpiece in your mouth and seal your lips tightly around it.  Breathe in slowly and as deeply as possible, raising the piston or the ball toward the top of the column.  Hold your breath for 3-5 seconds or for as long as possible. Allow the piston or ball to fall to the bottom of the column.  Remove the mouthpiece from your mouth and breathe out normally.  Rest for a few seconds and repeat Steps 1 through 7 at least 10 times every 1-2 hours when you are awake. Take your time and take a few normal breaths between  deep breaths.  The spirometer may include an indicator to show your best effort. Use the indicator as a goal to work toward during each repetition.  After each set of 10 deep breaths, practice coughing to be sure your lungs are clear. If you have an incision (the cut made at the time of surgery), support your incision when coughing by placing a pillow or rolled up towels firmly against it. Once you are able to get out of bed, walk around indoors and cough well. You may stop using the incentive spirometer when instructed by your caregiver.  RISKS AND COMPLICATIONS  Take your time so you do not get dizzy or light-headed.  If you are in pain, you may need to take or ask for pain medication before doing incentive spirometry. It is harder to take a deep breath if you are having pain. AFTER USE  Rest and breathe slowly and easily.  It can be helpful to keep track of a log of your progress. Your caregiver can provide you with a simple table to help with this. If you are using the spirometer at home, follow these instructions: [place] IF:   You are having difficultly using the spirometer.  You have trouble using the spirometer as often as instructed.  Your pain medication is not giving enough relief while using the spirometer.  You develop fever of 100.5 F (38.1 C) or  higher. SEEK IMMEDIATE MEDICAL CARE IF:   You cough up bloody sputum that had not been present before.  You develop fever of 102 F (38.9 C) or greater.  You develop worsening pain at or near the incision site. MAKE SURE YOU:   Understand these instructions.  Will watch your condition.  Will get help right away if you are not doing well or get worse. Document Released: 02/12/2007 Document Revised: 12/25/2011 Document Reviewed: 04/15/2007 ExitCare Patient Information 2014 ExitCare, [place].   ________________________________________________________________________  WHAT IS A BLOOD TRANSFUSION? Blood Transfusion Information  A transfusion is the replacement of blood or some of its parts. Blood is made up of multiple cells which provide different functions.  Red blood cells carry oxygen and are used for blood loss replacement.  White blood cells fight against infection.  Platelets control bleeding.  Plasma helps clot blood.  Other blood products are available for specialized needs, such as hemophilia or other clotting disorders. BEFORE THE TRANSFUSION  Who gives blood for transfusions?   Healthy volunteers who are fully evaluated to make sure their blood is safe. This is blood bank blood. Transfusion therapy is the safest it has ever been in the practice of medicine. Before blood is taken from a donor, a complete history is taken to make sure that person has no history of diseases nor engages in risky social behavior (examples are intravenous drug use or sexual activity with multiple partners). The donor's travel history is screened to minimize risk of transmitting infections, such as malaria. The donated blood is tested for signs of infectious diseases, such as HIV and hepatitis. The blood is then tested to be sure it is compatible with you in order to minimize the chance of a transfusion reaction. If you or a relative donates blood, this is often done in anticipation of surgery  and is not appropriate for emergency situations. It takes many days to process the donated blood. RISKS AND COMPLICATIONS Although transfusion therapy is very safe and saves many lives, the main dangers of transfusion include:   Getting an infectious disease.  Developing a transfusion reaction. This is an allergic reaction to something in the blood you were given. Every precaution is taken to prevent this. The decision to have a blood transfusion has been considered carefully by your caregiver before blood is given. Blood is not given unless the benefits outweigh the risks. AFTER THE TRANSFUSION  Right after receiving a blood transfusion, you will usually feel much better and more energetic. This is especially true if your red blood cells have gotten low (anemic). The transfusion raises the level of the red blood cells which carry oxygen, and this usually causes an energy increase.  The nurse administering the transfusion will monitor you carefully for complications. HOME CARE INSTRUCTIONS  No special instructions are needed after a transfusion. You may find your energy is better. Speak with your caregiver about any limitations on activity for underlying diseases you may have. SEEK MEDICAL CARE IF:   Your condition is not improving after your transfusion.  You develop redness or irritation at the intravenous (IV) site. SEEK IMMEDIATE MEDICAL CARE IF:  Any of the following symptoms occur over the next 12 hours:  Shaking chills.  You have a temperature by mouth above 102 F (38.9 C), not controlled by medicine.  Chest, back, or muscle pain.  People around you feel you are not acting correctly or are confused.  Shortness of breath or difficulty breathing.  Dizziness and fainting.  You get a rash or develop hives.  You have a decrease in urine output.  Your urine turns a dark color or changes to pink, red, or brown. Any of the following symptoms occur over the next 10  days:  You have a temperature by mouth above 102 F (38.9 C), not controlled by medicine.  Shortness of breath.  Weakness after normal activity.  The white part of the eye turns yellow (jaundice).  You have a decrease in the amount of urine or are urinating less often.  Your urine turns a dark color or changes to pink, red, or brown. Document Released: 09/29/2000 Document Revised: 12/25/2011 Document Reviewed: 05/18/2008 [facility] Patient Information 2014 [place], [place].  _______________________________________________________________________

## 2015-10-26 NOTE — Progress Notes (Signed)
Urinalysis and micro results in epic per PAT visit 10/26/2015 sent to Dr Charlann Boxer

## 2015-10-26 NOTE — Progress Notes (Signed)
Surgical screening results in epic per PAT visit 10/26/2015 positive for STAPH. Results sent to Dr Charlann Boxer. Prescription for Mupriocin Ointment called to [facility]. Spoke with [employer]. Pt aware.

## 2015-10-26 NOTE — Progress Notes (Signed)
Notified pt of surgical time change from 1445 to 1615 with time being 2:15 pm now of being at short stay. Pt also aware can continue clear liquid diet till 10:15 am day of surgery.

## 2015-10-26 NOTE — Progress Notes (Signed)
EKG per chart 10/05/2015

## 2015-10-27 NOTE — Progress Notes (Signed)
Patient called and notified of time change for surgery on Monday 11/01/2015- patient instructed  to arrive at Short Stay [facility] at 1115 am and can have clear liquids from midnight up until 0715 am then nothing until after surgery.

## 2015-11-01 ENCOUNTER — Inpatient Hospital Stay (HOSPITAL_COMMUNITY): Payer: BLUE CROSS/BLUE SHIELD | Admitting: Anesthesiology

## 2015-11-01 ENCOUNTER — Encounter (HOSPITAL_COMMUNITY)
Admission: AD | Disposition: A | Discharge status: Home Health | Payer: Self-pay | Source: Ambulatory Visit | Attending: Orthopedic Surgery

## 2015-11-01 ENCOUNTER — Inpatient Hospital Stay (HOSPITAL_COMMUNITY)
Admission: AD | Admit: 2015-11-01 | Discharge: 2015-11-02 | DRG: 470 | Disposition: A | Discharge status: Home Health | Payer: BLUE CROSS/BLUE SHIELD | Source: Ambulatory Visit | Attending: Orthopedic Surgery | Admitting: Orthopedic Surgery

## 2015-11-01 ENCOUNTER — Encounter (HOSPITAL_COMMUNITY): Payer: Self-pay | Admitting: *Deleted

## 2015-11-01 DIAGNOSIS — T402X5A Adverse effect of other opioids, initial encounter: Secondary | ICD-10-CM | POA: Diagnosis not present

## 2015-11-01 DIAGNOSIS — I10 Essential (primary) hypertension: Secondary | ICD-10-CM | POA: Diagnosis present

## 2015-11-01 DIAGNOSIS — M06862 Other specified rheumatoid arthritis, left knee: Principal | ICD-10-CM | POA: Diagnosis present

## 2015-11-01 DIAGNOSIS — Z96652 Presence of left artificial knee joint: Secondary | ICD-10-CM

## 2015-11-01 DIAGNOSIS — M65862 Other synovitis and tenosynovitis, left lower leg: Secondary | ICD-10-CM | POA: Diagnosis present

## 2015-11-01 DIAGNOSIS — R110 Nausea: Secondary | ICD-10-CM | POA: Diagnosis not present

## 2015-11-01 DIAGNOSIS — M25562 Pain in left knee: Secondary | ICD-10-CM | POA: Diagnosis present

## 2015-11-01 DIAGNOSIS — Z01812 Encounter for preprocedural laboratory examination: Secondary | ICD-10-CM

## 2015-11-01 DIAGNOSIS — Z96659 Presence of unspecified artificial knee joint: Secondary | ICD-10-CM

## 2015-11-01 HISTORY — PX: TOTAL KNEE ARTHROPLASTY: SHX125

## 2015-11-01 SURGERY — ARTHROPLASTY, KNEE, TOTAL
Anesthesia: Monitor Anesthesia Care | Site: Knee | Laterality: Left

## 2015-11-01 MED ORDER — DOCUSATE SODIUM 100 MG PO CAPS
100.0000 mg | ORAL_CAPSULE | Freq: Two times a day (BID) | ORAL | Status: DC
  Administered 2015-11-01 – 2015-11-02 (×2): 100 mg via ORAL

## 2015-11-01 MED ORDER — FENTANYL CITRATE (PF) 100 MCG/2ML IJ SOLN
INTRAMUSCULAR | Status: DC | PRN
  Administered 2015-11-01: 25 ug via INTRAVENOUS
  Administered 2015-11-01: 50 ug via INTRAVENOUS
  Administered 2015-11-01: 100 ug via INTRAVENOUS
  Administered 2015-11-01: 25 ug via INTRAVENOUS

## 2015-11-01 MED ORDER — BUPIVACAINE-EPINEPHRINE (PF) 0.25% -1:200000 IJ SOLN
INTRAMUSCULAR | Status: DC | PRN
  Administered 2015-11-01: 30 mL

## 2015-11-01 MED ORDER — BISACODYL 10 MG RE SUPP
10.0000 mg | Freq: Every day | RECTAL | Status: DC | PRN
Start: 2015-11-01 — End: 2015-11-02

## 2015-11-01 MED ORDER — CEFAZOLIN SODIUM-DEXTROSE 2-3 GM-% IV SOLR
2.0000 g | Freq: Four times a day (QID) | INTRAVENOUS | Status: AC
  Administered 2015-11-01 – 2015-11-02 (×2): 2 g via INTRAVENOUS
  Filled 2015-11-01 (×2): qty 50

## 2015-11-01 MED ORDER — OXYCODONE HCL 5 MG PO TABS
5.0000 mg | ORAL_TABLET | ORAL | Status: DC | PRN
  Administered 2015-11-01: 10 mg via ORAL
  Administered 2015-11-02: 15 mg via ORAL
  Filled 2015-11-01: qty 3
  Filled 2015-11-01: qty 2

## 2015-11-01 MED ORDER — HYDROMORPHONE HCL 1 MG/ML IJ SOLN
0.2500 mg | INTRAMUSCULAR | Status: DC | PRN
  Administered 2015-11-01 (×2): 0.5 mg via INTRAVENOUS

## 2015-11-01 MED ORDER — MEPERIDINE HCL 50 MG/ML IJ SOLN
INTRAMUSCULAR | Status: AC
  Filled 2015-11-01: qty 1

## 2015-11-01 MED ORDER — ONDANSETRON HCL 4 MG PO TABS: 4.0000 mg | ORAL_TABLET | Freq: Four times a day (QID) | ORAL | Status: DC | PRN

## 2015-11-01 MED ORDER — SODIUM CHLORIDE 0.9 % IR SOLN
Status: DC | PRN
  Administered 2015-11-01 (×2): 1000 mL

## 2015-11-01 MED ORDER — DEXAMETHASONE SODIUM PHOSPHATE 10 MG/ML IJ SOLN
10.0000 mg | Freq: Once | INTRAMUSCULAR | Status: AC
  Administered 2015-11-02: 10 mg via INTRAVENOUS
  Filled 2015-11-01: qty 1

## 2015-11-01 MED ORDER — MENTHOL 3 MG MT LOZG: 1.0000 | LOZENGE | OROMUCOSAL | Status: DC | PRN

## 2015-11-01 MED ORDER — METOCLOPRAMIDE HCL 5 MG/ML IJ SOLN: 5.0000 mg | Freq: Three times a day (TID) | INTRAMUSCULAR | Status: DC | PRN

## 2015-11-01 MED ORDER — MIDAZOLAM HCL 5 MG/5ML IJ SOLN
INTRAMUSCULAR | Status: DC | PRN
  Administered 2015-11-01: 1 mg via INTRAVENOUS
  Administered 2015-11-01: 2 mg via INTRAVENOUS
  Administered 2015-11-01: 1 mg via INTRAVENOUS

## 2015-11-01 MED ORDER — METHOCARBAMOL 500 MG PO TABS
500.0000 mg | ORAL_TABLET | Freq: Four times a day (QID) | ORAL | Status: DC | PRN
  Administered 2015-11-01 – 2015-11-02 (×2): 500 mg via ORAL
  Filled 2015-11-01 (×2): qty 1

## 2015-11-01 MED ORDER — ONDANSETRON HCL 4 MG/2ML IJ SOLN
INTRAMUSCULAR | Status: AC
  Filled 2015-11-01: qty 2

## 2015-11-01 MED ORDER — PHENYLEPHRINE HCL 10 MG/ML IJ SOLN
INTRAMUSCULAR | Status: DC | PRN
  Administered 2015-11-01 (×9): 80 ug via INTRAVENOUS

## 2015-11-01 MED ORDER — ONDANSETRON HCL 4 MG/2ML IJ SOLN
INTRAMUSCULAR | Status: DC | PRN
  Administered 2015-11-01: 4 mg via INTRAVENOUS

## 2015-11-01 MED ORDER — PROPOFOL 10 MG/ML IV BOLUS
INTRAVENOUS | Status: AC
  Filled 2015-11-01: qty 60

## 2015-11-01 MED ORDER — KETOROLAC TROMETHAMINE 30 MG/ML IJ SOLN
INTRAMUSCULAR | Status: DC | PRN
  Administered 2015-11-01: 30 mg via INTRAVENOUS

## 2015-11-01 MED ORDER — ZOLPIDEM TARTRATE 5 MG PO TABS
5.0000 mg | ORAL_TABLET | Freq: Every evening | ORAL | Status: DC | PRN
  Administered 2015-11-01: 5 mg via ORAL
  Filled 2015-11-01: qty 1

## 2015-11-01 MED ORDER — POLYETHYLENE GLYCOL 3350 17 G PO PACK
17.0000 g | PACK | Freq: Two times a day (BID) | ORAL | Status: DC
  Administered 2015-11-02: 17 g via ORAL

## 2015-11-01 MED ORDER — LACTATED RINGERS IV SOLN
INTRAVENOUS | Status: DC | PRN
  Administered 2015-11-01 (×3): via INTRAVENOUS

## 2015-11-01 MED ORDER — 0.9 % SODIUM CHLORIDE (POUR BTL) OPTIME
TOPICAL | Status: DC | PRN
  Administered 2015-11-01: 1000 mL

## 2015-11-01 MED ORDER — MAGNESIUM CITRATE PO SOLN: 1.0000 | Freq: Once | ORAL | Status: DC | PRN

## 2015-11-01 MED ORDER — MIDAZOLAM HCL 2 MG/2ML IJ SOLN
INTRAMUSCULAR | Status: AC
  Filled 2015-11-01: qty 2

## 2015-11-01 MED ORDER — CHLORHEXIDINE GLUCONATE 4 % EX LIQD: 60.0000 mL | Freq: Once | CUTANEOUS | Status: DC

## 2015-11-01 MED ORDER — FENTANYL CITRATE (PF) 100 MCG/2ML IJ SOLN
INTRAMUSCULAR | Status: AC
  Filled 2015-11-01: qty 2

## 2015-11-01 MED ORDER — DEXAMETHASONE SODIUM PHOSPHATE 10 MG/ML IJ SOLN
10.0000 mg | Freq: Once | INTRAMUSCULAR | Status: AC
  Administered 2015-11-01: 10 mg via INTRAVENOUS

## 2015-11-01 MED ORDER — METOCLOPRAMIDE HCL 10 MG PO TABS
5.0000 mg | ORAL_TABLET | Freq: Three times a day (TID) | ORAL | Status: DC | PRN
  Administered 2015-11-02: 10 mg via ORAL
  Filled 2015-11-01: qty 1

## 2015-11-01 MED ORDER — BUPIVACAINE IN DEXTROSE 0.75-8.25 % IT SOLN
INTRATHECAL | Status: DC | PRN
  Administered 2015-11-01: 1.7 mL via INTRATHECAL

## 2015-11-01 MED ORDER — ONDANSETRON HCL 4 MG/2ML IJ SOLN
4.0000 mg | Freq: Four times a day (QID) | INTRAMUSCULAR | Status: DC | PRN
  Administered 2015-11-01: 4 mg via INTRAVENOUS
  Filled 2015-11-01: qty 2

## 2015-11-01 MED ORDER — ATORVASTATIN CALCIUM 20 MG PO TABS
20.0000 mg | ORAL_TABLET | Freq: Every day | ORAL | Status: DC
  Administered 2015-11-02: 20 mg via ORAL
  Filled 2015-11-01 (×2): qty 1

## 2015-11-01 MED ORDER — METHOCARBAMOL 1000 MG/10ML IJ SOLN
500.0000 mg | Freq: Four times a day (QID) | INTRAVENOUS | Status: DC | PRN
  Administered 2015-11-01: 500 mg via INTRAVENOUS
  Filled 2015-11-01 (×2): qty 5

## 2015-11-01 MED ORDER — TRANEXAMIC ACID 1000 MG/10ML IV SOLN
1000.0000 mg | Freq: Once | INTRAVENOUS | Status: AC
  Administered 2015-11-01: 1000 mg via INTRAVENOUS
  Filled 2015-11-01: qty 10

## 2015-11-01 MED ORDER — PROPOFOL 500 MG/50ML IV EMUL
INTRAVENOUS | Status: DC | PRN
  Administered 2015-11-01: 120 ug/kg/min via INTRAVENOUS

## 2015-11-01 MED ORDER — PHENOL 1.4 % MT LIQD: 1.0000 | OROMUCOSAL | Status: DC | PRN

## 2015-11-01 MED ORDER — SODIUM CHLORIDE 0.9 % IJ SOLN
INTRAMUSCULAR | Status: DC | PRN
  Administered 2015-11-01: 30 mL

## 2015-11-01 MED ORDER — SODIUM CHLORIDE 0.9 % IJ SOLN
INTRAMUSCULAR | Status: AC
  Filled 2015-11-01: qty 50

## 2015-11-01 MED ORDER — FERROUS SULFATE 325 (65 FE) MG PO TABS
325.0000 mg | ORAL_TABLET | Freq: Three times a day (TID) | ORAL | Status: DC
  Administered 2015-11-02 (×2): 325 mg via ORAL
  Filled 2015-11-01 (×5): qty 1

## 2015-11-01 MED ORDER — HYDROCODONE-ACETAMINOPHEN 7.5-325 MG PO TABS
1.0000 | ORAL_TABLET | ORAL | Status: DC
  Administered 2015-11-01: 2 via ORAL
  Filled 2015-11-01: qty 2

## 2015-11-01 MED ORDER — MEPERIDINE HCL 50 MG/ML IJ SOLN
6.2500 mg | INTRAMUSCULAR | Status: DC | PRN
  Administered 2015-11-01: 12.5 mg via INTRAVENOUS

## 2015-11-01 MED ORDER — CEFAZOLIN SODIUM-DEXTROSE 2-3 GM-% IV SOLR
2.0000 g | INTRAVENOUS | Status: AC
  Administered 2015-11-01: 2 g via INTRAVENOUS

## 2015-11-01 MED ORDER — ZOLPIDEM TARTRATE 5 MG PO TABS: 5.0000 mg | ORAL_TABLET | Freq: Every evening | ORAL | Status: DC | PRN

## 2015-11-01 MED ORDER — ASPIRIN EC 325 MG PO TBEC
325.0000 mg | DELAYED_RELEASE_TABLET | Freq: Two times a day (BID) | ORAL | Status: DC
  Administered 2015-11-02: 325 mg via ORAL
  Filled 2015-11-01 (×3): qty 1

## 2015-11-01 MED ORDER — DIPHENHYDRAMINE HCL 25 MG PO CAPS: 25.0000 mg | ORAL_CAPSULE | Freq: Four times a day (QID) | ORAL | Status: DC | PRN

## 2015-11-01 MED ORDER — KETOROLAC TROMETHAMINE 30 MG/ML IJ SOLN
INTRAMUSCULAR | Status: AC
  Filled 2015-11-01: qty 1

## 2015-11-01 MED ORDER — STERILE WATER FOR IRRIGATION IR SOLN
Status: DC | PRN
  Administered 2015-11-01: 1500 mL

## 2015-11-01 MED ORDER — KETOROLAC TROMETHAMINE 15 MG/ML IJ SOLN
15.0000 mg | Freq: Four times a day (QID) | INTRAMUSCULAR | Status: DC
  Administered 2015-11-01 – 2015-11-02 (×4): 15 mg via INTRAVENOUS
  Filled 2015-11-01 (×7): qty 1

## 2015-11-01 MED ORDER — PREDNISONE 1 MG PO TABS
4.0000 mg | ORAL_TABLET | Freq: Every day | ORAL | Status: DC
  Filled 2015-11-01: qty 4

## 2015-11-01 MED ORDER — BUPIVACAINE-EPINEPHRINE (PF) 0.25% -1:200000 IJ SOLN
INTRAMUSCULAR | Status: AC
  Filled 2015-11-01: qty 30

## 2015-11-01 MED ORDER — SODIUM CHLORIDE 0.9 % IV SOLN
INTRAVENOUS | Status: DC
  Administered 2015-11-01 – 2015-11-02 (×2): via INTRAVENOUS
  Filled 2015-11-01 (×6): qty 1000

## 2015-11-01 MED ORDER — CEFAZOLIN SODIUM-DEXTROSE 2-3 GM-% IV SOLR
INTRAVENOUS | Status: AC
Start: 2015-11-01 — End: 2015-11-01
  Filled 2015-11-01: qty 50

## 2015-11-01 MED ORDER — HYDROMORPHONE HCL 1 MG/ML IJ SOLN
INTRAMUSCULAR | Status: AC
  Filled 2015-11-01: qty 1

## 2015-11-01 MED ORDER — PHENYLEPHRINE 40 MCG/ML (10ML) SYRINGE FOR IV PUSH (FOR BLOOD PRESSURE SUPPORT)
PREFILLED_SYRINGE | INTRAVENOUS | Status: AC
  Filled 2015-11-01: qty 10

## 2015-11-01 MED ORDER — PHENYLEPHRINE 40 MCG/ML (10ML) SYRINGE FOR IV PUSH (FOR BLOOD PRESSURE SUPPORT)
PREFILLED_SYRINGE | INTRAVENOUS | Status: AC
  Filled 2015-11-01: qty 30

## 2015-11-01 MED ORDER — ALUM & MAG HYDROXIDE-SIMETH 200-200-20 MG/5ML PO SUSP: 30.0000 mL | ORAL | Status: DC | PRN

## 2015-11-01 MED ORDER — CELECOXIB 200 MG PO CAPS
200.0000 mg | ORAL_CAPSULE | Freq: Two times a day (BID) | ORAL | Status: DC
  Administered 2015-11-01 – 2015-11-02 (×2): 200 mg via ORAL
  Filled 2015-11-01 (×3): qty 1

## 2015-11-01 MED ORDER — MIDAZOLAM HCL 2 MG/2ML IJ SOLN
INTRAMUSCULAR | Status: AC
Start: 2015-11-01 — End: 2015-11-01
  Filled 2015-11-01: qty 2

## 2015-11-01 MED ORDER — PROPOFOL 10 MG/ML IV BOLUS
INTRAVENOUS | Status: DC | PRN
  Administered 2015-11-01: 30 mg via INTRAVENOUS
  Administered 2015-11-01: 40 mg via INTRAVENOUS

## 2015-11-01 MED ORDER — DEXAMETHASONE SODIUM PHOSPHATE 10 MG/ML IJ SOLN
INTRAMUSCULAR | Status: AC
  Filled 2015-11-01: qty 1

## 2015-11-01 MED ORDER — HYDROMORPHONE HCL 1 MG/ML IJ SOLN
0.5000 mg | INTRAMUSCULAR | Status: DC | PRN
Start: 2015-11-01 — End: 2015-11-02
  Administered 2015-11-01: 2 mg via INTRAVENOUS
  Administered 2015-11-01 – 2015-11-02 (×2): 1 mg via INTRAVENOUS
  Filled 2015-11-01 (×4): qty 1

## 2015-11-01 SURGICAL SUPPLY — 47 items
BAG DECANTER FOR FLEXI CONT (MISCELLANEOUS) IMPLANT
BAG SPEC THK2 15X12 ZIP CLS (MISCELLANEOUS) ×1
BAG ZIPLOCK 12X15 (MISCELLANEOUS) ×2 IMPLANT
BANDAGE ACE 6X5 VEL STRL LF (GAUZE/BANDAGES/DRESSINGS) ×2 IMPLANT
BLADE SAW SGTL 13.0X1.19X90.0M (BLADE) ×2 IMPLANT
BOWL SMART MIX CTS (DISPOSABLE) ×2 IMPLANT
CAPT KNEE TOTAL 3 ATTUNE ×2 IMPLANT
CEMENT HV SMART SET (Cement) ×4 IMPLANT
CLOTH BEACON ORANGE TIMEOUT ST (SAFETY) ×2 IMPLANT
CUFF TOURN SGL QUICK 34 (TOURNIQUET CUFF) ×2
CUFF TRNQT CYL 34X4X40X1 (TOURNIQUET CUFF) ×1 IMPLANT
DECANTER SPIKE VIAL GLASS SM (MISCELLANEOUS) ×2 IMPLANT
DRAPE U-SHAPE 47X51 STRL (DRAPES) ×2 IMPLANT
DRSG AQUACEL AG ADV 3.5X10 (GAUZE/BANDAGES/DRESSINGS) ×2 IMPLANT
DURAPREP 26ML APPLICATOR (WOUND CARE) ×4 IMPLANT
ELECT REM PT RETURN 9FT ADLT (ELECTROSURGICAL) ×2
ELECTRODE REM PT RTRN 9FT ADLT (ELECTROSURGICAL) ×1 IMPLANT
GAUZE SPONGE 4X4 12PLY STRL (GAUZE/BANDAGES/DRESSINGS) ×2 IMPLANT
GLOVE BIO SURGEON STRL SZ7.5 (GLOVE) ×2 IMPLANT
GLOVE BIOGEL M 7.0 STRL (GLOVE) IMPLANT
GLOVE BIOGEL PI IND STRL 7.5 (GLOVE) ×1 IMPLANT
GLOVE BIOGEL PI IND STRL 8.5 (GLOVE) ×1 IMPLANT
GLOVE BIOGEL PI INDICATOR 7.5 (GLOVE) ×1
GLOVE BIOGEL PI INDICATOR 8.5 (GLOVE) ×1
GLOVE ECLIPSE 8.0 STRL XLNG CF (GLOVE) ×2 IMPLANT
GLOVE ORTHO TXT STRL SZ7.5 (GLOVE) ×4 IMPLANT
GOWN STRL REUS W/TWL LRG LVL3 (GOWN DISPOSABLE) ×2 IMPLANT
GOWN STRL REUS W/TWL XL LVL3 (GOWN DISPOSABLE) ×2 IMPLANT
HANDPIECE INTERPULSE COAX TIP (DISPOSABLE) ×2
LIQUID BAND (GAUZE/BANDAGES/DRESSINGS) ×2 IMPLANT
MANIFOLD NEPTUNE II (INSTRUMENTS) ×2 IMPLANT
PACK TOTAL KNEE CUSTOM (KITS) ×2 IMPLANT
POSITIONER SURGICAL ARM (MISCELLANEOUS) ×4 IMPLANT
SET HNDPC FAN SPRY TIP SCT (DISPOSABLE) ×1 IMPLANT
SET PAD KNEE POSITIONER (MISCELLANEOUS) ×2 IMPLANT
SUCTION FRAZIER 12FR DISP (SUCTIONS) IMPLANT
SUT MNCRL AB 4-0 PS2 18 (SUTURE) ×2 IMPLANT
SUT VIC AB 1 CT1 36 (SUTURE) ×2 IMPLANT
SUT VIC AB 2-0 CT1 27 (SUTURE) ×6
SUT VIC AB 2-0 CT1 TAPERPNT 27 (SUTURE) ×3 IMPLANT
SUT VLOC 180 0 24IN GS25 (SUTURE) ×2 IMPLANT
SYR 50ML LL SCALE MARK (SYRINGE) ×2 IMPLANT
TRAY FOLEY W/METER SILVER 14FR (SET/KITS/TRAYS/PACK) ×2 IMPLANT
TRAY FOLEY W/METER SILVER 16FR (SET/KITS/TRAYS/PACK) IMPLANT
WATER STERILE IRR 1500ML POUR (IV SOLUTION) ×2 IMPLANT
WRAP KNEE MAXI GEL POST OP (GAUZE/BANDAGES/DRESSINGS) ×2 IMPLANT
YANKAUER SUCT BULB TIP 10FT TU (MISCELLANEOUS) IMPLANT

## 2015-11-01 NOTE — Discharge Instructions (Signed)

## 2015-11-01 NOTE — Anesthesia Preprocedure Evaluation (Addendum)
Anesthesia Evaluation  Patient identified by MRN, date of birth, ID band Patient awake    Reviewed: Allergy & Precautions, H&P , NPO status , Patient's Chart, lab work & pertinent test results  History of Anesthesia Complications (+) PONV  Airway Mallampati: II  TM Distance: >3 FB Neck ROM: Limited    Dental no notable dental hx. (+) Teeth Intact, Dental Advisory Given   Pulmonary neg pulmonary ROS,    Pulmonary exam normal breath sounds clear to auscultation       Cardiovascular hypertension, Pt. on medications  Rhythm:Regular Rate:Normal     Neuro/Psych  Headaches, negative psych ROS   GI/Hepatic negative GI ROS, Neg liver ROS,   Endo/Other  negative endocrine ROS  Renal/GU negative Renal ROS  negative genitourinary   Musculoskeletal  (+) Arthritis , Osteoarthritis,    Abdominal   Peds  Hematology negative hematology ROS (+)   Anesthesia Other Findings   Reproductive/Obstetrics negative OB ROS                            Anesthesia Physical Anesthesia Plan  ASA: II  Anesthesia Plan: MAC and Spinal   Post-op Pain Management:    Induction: Intravenous  Airway Management Planned: Simple Face Mask  Additional Equipment:   Intra-op Plan:   Post-operative Plan:   Informed Consent: I have reviewed the patients History and Physical, chart, labs and discussed the procedure including the risks, benefits and alternatives for the proposed anesthesia with the patient or authorized representative who has indicated his/her understanding and acceptance.   Dental advisory given  Plan Discussed with: CRNA  Anesthesia Plan Comments:         Anesthesia Quick Evaluation

## 2015-11-01 NOTE — Op Note (Signed)
NAME:  [name]                      MEDICAL RECORD NO.:  [id number]                             FACILITY:  [facility]      PHYSICIAN:  Madlyn Frankel. Charlann Boxer, M.D.  DATE OF BIRTH:  14-May-1956      DATE OF PROCEDURE:  11/01/2015                                     OPERATIVE REPORT         PREOPERATIVE DIAGNOSIS:  Left knee rheumatoid arthritis      POSTOPERATIVE DIAGNOSIS:  Left knee rheumatoid arthritis     FINDINGS:  The patient was noted to have complete loss of cartilage and   bone-on-bone arthritis with associated osteophytes in all three compartments of   the knee most significantly effecting the lateral and patellofemoral compartments with a significant synovitis and associated effusion.      PROCEDURE:  Left total knee replacement.      COMPONENTS USED:  DePuy Attune rotating platform posterior stabilized knee   system, a size 4 femur, 3 tibia, size 8 mm PS AOX insert, and 35 anatomic patellar   button.      SURGEON:  Madlyn Frankel. Charlann Boxer, M.D.      ASSISTANT:  Lanney Gins, PA-C.      ANESTHESIA:  Spinal.      SPECIMENS:  None.      COMPLICATION:  None.      DRAINS:  None  EBL: <50cc      TOURNIQUET TIME:   Total Tourniquet Time Documented: Thigh (Left) - 27 minutes Total: Thigh (Left) - 27 minutes      The patient was stable to the recovery room.      INDICATION FOR PROCEDURE:  [name] is a 60 y.o. female patient of   mine.  The patient had been seen, evaluated, and treated conservatively in the   office with medication, activity modification, and injections.  The patient had   radiographic changes of bone-on-bone arthritis with endplate sclerosis and osteophytes noted.      The patient failed conservative measures including medication, injections, and activity modification, and at this point was ready for more definitive measures.   Based on the radiographic changes and failed conservative measures, the patient   decided to proceed with total knee  replacement.  Risks of infection,   DVT, component failure, need for revision surgery, postop course, and   expectations were all   discussed and reviewed.  Consent was obtained for benefit of pain   relief.      PROCEDURE IN DETAIL:  The patient was brought to the operative theater.   Once adequate anesthesia, preoperative antibiotics, 2 gm of Ancef, 1 gm of Tranexamic Acid, and 10 mg of Decadron administered, the patient was positioned supine with the left thigh tourniquet placed.  The  left lower extremity was prepped and draped in sterile fashion.  A time-   out was performed identifying the patient, planned procedure, and   extremity.      The left lower extremity was placed in the [facility] leg holder.  The leg was   exsanguinated, tourniquet elevated to 250 mmHg.  A midline  incision was   made followed by median parapatellar arthrotomy.  Following initial   exposure, attention was first directed to the patella.  Precut   measurement was noted to be 21 mm.  I resected down to 13-14 mm and used a   35 patellar button to restore patellar height as well as cover the cut   surface.      The lug holes were drilled and a metal shim was placed to protect the   patella from retractors and saw blades.      At this point, attention was now directed to the femur.  The femoral   canal was opened with a drill, irrigated to try to prevent fat emboli.  An   intramedullary rod was passed at 3 degrees valgus, 9 mm of bone was   resected off the distal femur.  Following this resection, the tibia was   subluxated anteriorly.  Using the extramedullary guide, 2 mm of bone was resected off   the proximal lateral tibia.  We confirmed the gap would be   stable medially and laterally with a size 6 mm insert as well as confirmed   the cut was perpendicular in the coronal plane, checking with an alignment rod.      Once this was done, I sized the femur to be a size 4 in the anterior-   posterior dimension,  chose a standard component based on medial and   lateral dimension.  The size 4 rotation block was then pinned in   position anterior referenced using the C-clamp to set rotation.  The   anterior, posterior, and  chamfer cuts were made without difficulty nor   notching making certain that I was along the anterior cortex to help   with flexion gap stability.      The final box cut was made off the lateral aspect of distal femur.      At this point, the tibia was sized to be a size 3, the size 3 tray was   then pinned in position through the medial third of the tubercle,   drilled, and keel punched.  Trial reduction was now carried with a 4 femur,  3 tibia, a size 6 then up to the size 8 mm insert, and the 35 patella botton.  The knee was brought to   extension, full extension with good flexion stability with the patella   tracking through the trochlea without application of pressure.  Given   all these findings I drilled the femoral lug holes and then the trial components were removed.  Final components were   opened and cement was mixed.  The knee was irrigated with normal saline   solution and pulse lavage.  The synovial lining was   then injected with 30 cc of 0.25% Marcaine with epinephrine and 1 cc of Toradol plus 30 cc of NS for a   total of 61 cc.      The knee was irrigated.  Final implants were then cemented onto clean and   dried cut surfaces of bone with the knee brought to extension with a size 8 mm trial insert.      Once the cement had fully cured, the excess cement was removed   throughout the knee.  I confirmed I was satisfied with the range of   motion and stability, and the final size 8 mm PS AOX insert was chosen.  It was   placed into the knee.  The tourniquet had been let down at 27 minutes.  No significant   hemostasis required.  The   extensor mechanism was then reapproximated using #1 Vicryl and #0 V-lock sutures with the knee   in flexion.  The   remaining  wound was closed with 2-0 Vicryl and running 4-0 Monocryl.   The knee was cleaned, dried, dressed sterilely using Dermabond and   Aquacel dressing.  The patient was then   brought to recovery room in stable condition, tolerating the procedure   well.   Please note that Physician Assistant, Lanney Gins, PA-C, was present for the entirety of the case, and was utilized for pre-operative positioning, peri-operative retractor management, general facilitation of the procedure.  He was also utilized for primary wound closure at the end of the case.              Madlyn Frankel Charlann Boxer, M.D.    11/01/2015 2:32 PM

## 2015-11-01 NOTE — Interval H&P Note (Signed)
History and Physical Interval Note:  11/01/2015 12:14 PM  [name]  has presented today for surgery, with the diagnosis of LEFT KNEE OA  The various methods of treatment have been discussed with the patient and family. After consideration of risks, benefits and other options for treatment, the patient has consented to  Procedure(s): LEFT TOTAL KNEE ARTHROPLASTY (Left) as a surgical intervention .  The patient's history has been reviewed, patient examined, no change in status, stable for surgery.  I have reviewed the patient's chart and labs.  Questions were answered to the patient's satisfaction.     [name]

## 2015-11-01 NOTE — Anesthesia Procedure Notes (Signed)
Spinal Patient location during procedure: OR Start time: 11/01/2015 12:53 PM End time: 11/01/2015 1:03 PM Staffing Resident/CRNA: Brandun Pinn Performed by: resident/CRNA  Preanesthetic Checklist Completed: patient identified, site marked, surgical consent, pre-op evaluation, timeout performed, IV checked, risks and benefits discussed and monitors and equipment checked Spinal Block Patient position: sitting Prep: Betadine Patient monitoring: heart rate, cardiac monitor, continuous pulse ox and blood pressure Approach: midline Location: L3-4 Injection technique: single-shot Needle Needle type: Spinocan and Sprotte  Needle gauge: 24 G Needle length: 9 cm Needle insertion depth: 6.5 cm Assessment Sensory level: T6 Additional Notes -heme, -para, VSS.  Exp  2017-02-12 Lot  [id number]

## 2015-11-01 NOTE — Anesthesia Postprocedure Evaluation (Signed)
Anesthesia Post Note  Patient: [name]  Procedure(s) Performed: Procedure(s) (LRB): LEFT TOTAL KNEE ARTHROPLASTY (Left)  Patient location during evaluation: PACU Anesthesia Type: Spinal and MAC Level of consciousness: awake and alert Pain management: pain level controlled Vital Signs Assessment: post-procedure vital signs reviewed and stable Respiratory status: spontaneous breathing, respiratory function stable and patient connected to nasal cannula oxygen Cardiovascular status: blood pressure returned to baseline and stable Postop Assessment: spinal receding Anesthetic complications: no    Last Vitals:  Filed Vitals:   11/01/15 1500 11/01/15 1515  BP: 145/96 132/82  Pulse: 77 67  Temp:    Resp: 19 9    Last Pain:  Filed Vitals:   11/01/15 1528  PainSc: 0-No pain                 Blaise Palladino,W. EDMOND

## 2015-11-01 NOTE — Transfer of Care (Signed)
Immediate Anesthesia Transfer of Care Note  Patient: [name]  Procedure(s) Performed: Procedure(s): LEFT TOTAL KNEE ARTHROPLASTY (Left)  Patient Location: PACU  Anesthesia Type:Spinal  Level of Consciousness:  sedated, patient cooperative and responds to stimulation  Airway & Oxygen Therapy:Patient Spontanous Breathing and Patient connected to face mask oxgen  Post-op Assessment:  Report given to PACU RN and Post -op Vital signs reviewed and stable  Post vital signs:  Reviewed and stable  Last Vitals:  Filed Vitals:   11/01/15 1119  BP: 130/90  Pulse: 123  Temp: 36.7 C  Resp: 18    Complications: No apparent anesthesia complications

## 2015-11-02 LAB — BASIC METABOLIC PANEL
ANION GAP: 6 (ref 5–15)
BUN: 18 mg/dL (ref 6–20)
CALCIUM: 8.4 mg/dL — AB (ref 8.9–10.3)
CHLORIDE: 115 mmol/L — AB (ref 101–111)
CO2: 24 mmol/L (ref 22–32)
Creatinine, Ser: 0.88 mg/dL (ref 0.44–1.00)
GFR calc non Af Amer: >60 mL/min (ref >60)
GLUCOSE: 125 mg/dL — AB (ref 65–99)
POTASSIUM: 4.4 mmol/L (ref 3.5–5.1)
Sodium: 145 mmol/L (ref 135–145)

## 2015-11-02 LAB — CBC
HEMATOCRIT: 32.2 % — AB (ref 36.0–46.0)
HEMOGLOBIN: 10.4 g/dL — AB (ref 12.0–15.0)
MCH: 29.4 pg (ref 26.0–34.0)
MCHC: 32.3 g/dL (ref 30.0–36.0)
MCV: 91 fL (ref 78.0–100.0)
Platelets: 244 10*3/uL (ref 150–400)
RBC: 3.54 MIL/uL — AB (ref 3.87–5.11)
RDW: 13.2 % (ref 11.5–15.5)
WBC: 11.6 10*3/uL — AB (ref 4.0–10.5)

## 2015-11-02 MED ORDER — CELECOXIB 200 MG PO CAPS: 200.0000 mg | ORAL_CAPSULE | Freq: Two times a day (BID) | ORAL | Status: DC

## 2015-11-02 MED ORDER — DOCUSATE SODIUM 100 MG PO CAPS: 100.0000 mg | ORAL_CAPSULE | Freq: Two times a day (BID) | ORAL | Status: DC

## 2015-11-02 MED ORDER — HYDROCODONE-ACETAMINOPHEN 7.5-325 MG PO TABS: 1.0000 | ORAL_TABLET | ORAL | Status: DC | PRN

## 2015-11-02 MED ORDER — HYDROCODONE-ACETAMINOPHEN 7.5-325 MG PO TABS
1.0000 | ORAL_TABLET | ORAL | Status: DC | PRN
  Administered 2015-11-02 (×2): 2 via ORAL
  Filled 2015-11-02 (×2): qty 2

## 2015-11-02 MED ORDER — FERROUS SULFATE 325 (65 FE) MG PO TABS: 325.0000 mg | ORAL_TABLET | Freq: Three times a day (TID) | ORAL | Status: DC

## 2015-11-02 MED ORDER — POLYETHYLENE GLYCOL 3350 17 G PO PACK: 17.0000 g | PACK | Freq: Two times a day (BID) | ORAL | Status: DC

## 2015-11-02 MED ORDER — ASPIRIN 325 MG PO TBEC: 325.0000 mg | DELAYED_RELEASE_TABLET | Freq: Two times a day (BID) | ORAL | Status: AC

## 2015-11-02 MED ORDER — METHOCARBAMOL 500 MG PO TABS: 500.0000 mg | ORAL_TABLET | Freq: Four times a day (QID) | ORAL | Status: DC | PRN

## 2015-11-02 NOTE — Progress Notes (Signed)
Physical Therapy Treatment Patient Details Name: [name] MRN: [id number] DOB: October 25, 1955 Today's Date: 11/02/2015    History of Present Illness 60 yo female s/p L TKA 11/01/15. Hx of RA, cervical fusion.     PT Comments    Progressing well with mobility. Practiced gait training and stair training. All education completed. Ready to d/c from PT standpoint. Made charge nurse aware.  Follow Up Recommendations  Home health PT     Equipment Recommendations  None recommended by PT    Recommendations for Other Services       Precautions / Restrictions Precautions Precautions: Fall;Knee Restrictions Weight Bearing Restrictions: No LLE Weight Bearing: Weight bearing as tolerated    Mobility  Bed Mobility     General bed mobility comments: oob in recliner  Transfers Overall transfer level: Needs assistance Equipment used: Rolling walker (2 wheeled) Transfers: Sit to/from Stand Sit to Stand: Supervision         General transfer comment: for safety.   Ambulation/Gait Ambulation/Gait assistance: Supervision Ambulation Distance (Feet): 100 Feet Assistive device: Rolling walker (2 wheeled) Gait Pattern/deviations: Step-to pattern;Step-through pattern     General Gait Details: for safety.    Stairs Stairs: Yes Stairs assistance: Min guard Stair Management: Step to pattern;Forwards;Two rails Number of Stairs: 2 General stair comments: close guard for safety. VCs safety, technique, sequence.   Wheelchair Mobility    Modified Rankin (Stroke Patients Only)       Balance Overall balance assessment: Needs assistance         Standing balance support: Bilateral upper extremity supported;During functional activity Standing balance-Leahy Scale: Poor                      Cognition Arousal/Alertness: Awake/alert Behavior During Therapy: WFL for tasks assessed/performed Overall Cognitive Status: Within Functional Limits for tasks assessed                      Exercises Total Joint Exercises Ankle Circles/Pumps: AROM;Both;10 reps;Supine Quad Sets: AROM;Both;10 reps;Supine Heel Slides: AAROM;Left;10 reps;Supine Hip ABduction/ADduction: AAROM;Left;10 reps;Supine Straight Leg Raises: AAROM;Left;10 reps;Supine Goniometric ROM: ~10-50 degrees    General Comments        Pertinent Vitals/Pain Pain Assessment: 0-10 Pain Score: 8  Pain Location: L knee/thigh with activity Pain Descriptors / Indicators: Sore Pain Intervention(s): Monitored during session;Ice applied;Repositioned    Home Living Family/patient expects to be discharged to:: Private residence Living Arrangements: Spouse/significant other   Type of Home: House Home Access: Stairs to enter Entrance Stairs-Rails: Right;Left Home Layout: Two level;Able to live on main level with bedroom/bathroom Home Equipment: Dan Humphreys - 2 wheels;Bedside commode;Shower seat      Prior Function Level of Independence: Independent          PT Goals (current goals can now be found in the care plan section) Acute Rehab PT Goals Patient Stated Goal: home soon. regain PLOF PT Goal Formulation: With patient Time For Goal Achievement: 11/09/15 Potential to Achieve Goals: Good Progress towards PT goals: Progressing toward goals    Frequency  7X/week    PT Plan Current plan remains appropriate    Co-evaluation             End of Session Equipment Utilized During Treatment: Gait belt Activity Tolerance: Patient tolerated treatment well Patient left: in chair;with call bell/phone within reach     Time: 1321-1333 PT Time Calculation (min) (ACUTE ONLY): 12 min  Charges:  $Gait Training: 8-22 mins  G Codes:      Weston Anna, MPT Pager: [phone]

## 2015-11-02 NOTE — Progress Notes (Signed)
OT Cancellation Note  Patient Details Name: [name] MRN: [id number] DOB: September 29, 1956   Cancelled Treatment:    Reason Eval/Treat Not Completed: OT screened, no needs identified, will sign off. Pt with recent h/o of TKA. Per PT, pt reporting no OT needs. Will screen out.   Pilar Grammes 11/02/2015, 10:08 AM

## 2015-11-02 NOTE — Discharge Summary (Signed)
Physician Discharge Summary  Patient ID: [name] MRN: [id number] DOB/AGE: 23-Jul-1956 60 y.o.  Admit date: 11/01/2015 Discharge date: 11/02/2015   Procedures:  Procedure(s) (LRB): LEFT TOTAL KNEE ARTHROPLASTY (Left)  Attending Physician:  Dr. Durene Romans   Admission Diagnoses:   Left knee primary OA / pain  Discharge Diagnoses:  Principal Problem:   S/P left TKA Active Problems:   S/P right TKA  Past Medical History  Diagnosis Date  . Hypertension   . Arthritis   . Arthritis with psoriasis (HCC)   . Osteoporosis   . Psoriasis   . Bruises easily   . Difficulty sleeping   . History of nonmelanoma skin cancer   . Complication of anesthesia     pt states awaken during operating room during right knee surgery   . PONV (postoperative nausea and vomiting)   . Right atrial enlargement   . Wears glasses   . Fall     2015  . Concussion     2015  . HA (headache)     migraine history secondary to fall in 2015 no current issues  . History of bronchitis     1990's   . History of urinary tract infection   . Squamous cell carcinoma (HCC)     right ankle     HPI:    [name], 60 y.o. female, has a history of pain and functional disability in the left knee due to arthritis and has failed non-surgical conservative treatments for greater than 12 weeks to include NSAID's and/or analgesics and activity modification. Onset of symptoms was gradual, starting 3+ years ago with gradually worsening course since that time. The patient noted prior procedures on the knee to include arthroplasty on the right knee per Dr. Charlann Boxer on 10/30/2014. Patient currently rates pain in the left knee(s) at 9 out of 10 with activity. Patient has worsening of pain with activity and weight bearing, pain that interferes with activities of daily living, pain with passive range of motion, crepitus and joint swelling. Patient has evidence of periarticular osteophytes and joint space narrowing by  imaging studies. There is no active infection. Risks, benefits and expectations were discussed with the patient. Risks including but not limited to the risk of anesthesia, blood clots, nerve damage, blood vessel damage, failure of the prosthesis, infection and up to and including death. Patient understand the risks, benefits and expectations and wishes to proceed with surgery.   PCP: Allean Found, MD   Discharged Condition: good  Hospital Course:  Patient underwent the above stated procedure on 11/01/2015. Patient tolerated the procedure well and brought to the recovery room in good condition and subsequently to the floor.  POD #1 BP: 133/93 ; Pulse: 80 ; Temp: 98 F (36.7 C) ; Resp: 18 Patient reports pain as mild, pain controlled. States that she has some nausea last night, associated with the Oxycodone. Would like to switch back to Norco, which she has done well on. Otherwise no events throughout the night. Ready to be discharged home Dorsiflexion/plantar flexion intact, incision: dressing C/D/I, no cellulitis present and compartment soft.   LABS  Basename    HGB     10.4  HCT     32.2    Discharge Exam: General appearance: alert, cooperative and no distress Extremities: Homans sign is negative, no sign of DVT, no edema, redness or tenderness in the calves or thighs and no ulcers, gangrene or trophic changes  Disposition: Home with follow up in 2  weeks   Follow-up Information    Follow up with [name], MD. Schedule an appointment as soon as possible for a visit in 2 weeks.   Specialty:  Orthopedic Surgery   Contact information:   [address] [place] [address] [phone]       Follow up with [facility].   Why:  home health physical therapy   Contact information:   [address] [place] [address] [phone]       Discharge Instructions    Call MD / Call 911    Complete by:  As directed   If you  experience chest pain or shortness of breath, CALL 911 and be transported to the hospital emergency room.  If you develope a fever above 101 F, pus (white drainage) or increased drainage or redness at the wound, or calf pain, call your surgeon's office.     Change dressing    Complete by:  As directed   Maintain surgical dressing until follow up in the clinic. If the edges start to pull up, may reinforce with tape. If the dressing is no longer working, may remove and cover with gauze and tape, but must keep the area dry and clean.  Call with any questions or concerns.     Constipation Prevention    Complete by:  As directed   Drink plenty of fluids.  Prune juice may be helpful.  You may use a stool softener, such as Colace (over the counter) 100 mg twice a day.  Use MiraLax (over the counter) for constipation as needed.     Diet - low sodium heart healthy    Complete by:  As directed      Discharge instructions    Complete by:  As directed   Maintain surgical dressing until follow up in the clinic. If the edges start to pull up, may reinforce with tape. If the dressing is no longer working, may remove and cover with gauze and tape, but must keep the area dry and clean.  Follow up in 2 weeks at [facility]. Call with any questions or concerns.     Increase activity slowly as tolerated    Complete by:  As directed   Weight bearing as tolerated with assist device (walker, cane, etc) as directed, use it as long as suggested by your surgeon or therapist, typically at least 4-6 weeks.     TED hose    Complete by:  As directed   Use stockings (TED hose) for 2 weeks on both leg(s).  You may remove them at night for sleeping.             Medication List    STOP taking these medications        HUMIRA PEN 40 MG/0.8ML Pnkt  Generic drug:  Adalimumab     leflunomide 20 MG tablet  Commonly known as:  ARAVA     traMADol 50 MG tablet  Commonly known as:  ULTRAM      TAKE these  medications        aspirin 325 MG EC tablet  Take 1 tablet (325 mg total) by mouth 2 (two) times daily.     atorvastatin 20 MG tablet  Commonly known as:  LIPITOR  Take 20 mg by mouth daily.     CALCIUM CARBONATE-VITAMIN D PO  Take 1 tablet by mouth daily.     celecoxib 200 MG capsule  Commonly known as:  CELEBREX  Take 1 capsule (200 mg total) by mouth every 12 (twelve) hours.     docusate sodium 100 MG capsule  Commonly known as:  COLACE  Take 1 capsule (100 mg total) by mouth 2 (two) times daily.     ferrous sulfate 325 (65 FE) MG tablet  Take 1 tablet (325 mg total) by mouth 3 (three) times daily after meals.     HYDROcodone-acetaminophen 7.5-325 MG tablet  Commonly known as:  NORCO  Take 1-2 tablets by mouth every 4 (four) hours as needed for moderate pain.     lisinopril 10 MG tablet  Commonly known as:  PRINIVIL,ZESTRIL  Take 10 mg by mouth daily with breakfast.     methocarbamol 500 MG tablet  Commonly known as:  ROBAXIN  Take 1 tablet (500 mg total) by mouth every 6 (six) hours as needed for muscle spasms.     multivitamin with minerals Tabs tablet  Take 1 tablet by mouth daily.     polyethylene glycol packet  Commonly known as:  MIRALAX / GLYCOLAX  Take 17 g by mouth 2 (two) times daily.     predniSONE 1 MG tablet  Commonly known as:  DELTASONE  Take 4 mg by mouth daily.     zolpidem 12.5 MG CR tablet  Commonly known as:  AMBIEN CR  Take 12.5 mg by mouth at bedtime as needed for sleep.         Signed: Anastasio Auerbach. Niketa Turner   PA-C  11/02/2015, 3:34 PM

## 2015-11-02 NOTE — Evaluation (Signed)
Physical Therapy Evaluation Patient Details Name: [name] MRN: [id number] DOB: 12/02/1955 Today's Date: 11/02/2015   History of Present Illness  60 yo female s/p L TKA 11/01/15. Hx of RA, cervical fusion.   Clinical Impression  On eval, pt required Min assist for mobility-walked ~70 feet with RW. Pain rated 8/10 with ambulation. Pt plans to d/c later today. Will have a 2nd session to practice stair negotiation. Recommend HHPT follow up.     Follow Up Recommendations Home health PT    Equipment Recommendations  None recommended by PT    Recommendations for Other Services       Precautions / Restrictions Precautions Precautions: Fall;Knee Restrictions Weight Bearing Restrictions: No LLE Weight Bearing: Weight bearing as tolerated      Mobility  Bed Mobility Overal bed mobility: Needs Assistance Bed Mobility: Supine to Sit     Supine to sit: HOB elevated;Min assist     General bed mobility comments: Assist for LE off bed.   Transfers Overall transfer level: Needs assistance Equipment used: Rolling walker (2 wheeled) Transfers: Sit to/from Stand Sit to Stand: Min guard         General transfer comment: close guard for safety. VCs safety, hand/LE placement  Ambulation/Gait Ambulation/Gait assistance: Min guard Ambulation Distance (Feet): 70 Feet Assistive device: Rolling walker (2 wheeled) Gait Pattern/deviations: Step-to pattern;Trunk flexed     General Gait Details: close guard for safety. Increased pain with ambulation.   Stairs            Wheelchair Mobility    Modified Rankin (Stroke Patients Only)       Balance Overall balance assessment: Needs assistance         Standing balance support: Bilateral upper extremity supported;During functional activity Standing balance-Leahy Scale: Poor                               Pertinent Vitals/Pain Pain Assessment: 0-10 Pain Score: 8  Pain Location: L knee with  activity Pain Descriptors / Indicators: Sore;Aching Pain Intervention(s): Monitored during session;Premedicated before session;Ice applied;Repositioned    Home Living Family/patient expects to be discharged to:: Private residence Living Arrangements: Spouse/significant other   Type of Home: House Home Access: Stairs to enter Entrance Stairs-Rails: Doctor, general practice of Steps: 5 Home Layout: Two level;Able to live on main level with bedroom/bathroom Home Equipment: Dan Humphreys - 2 wheels;Bedside commode;Shower seat      Prior Function Level of Independence: Independent               Hand Dominance        Extremity/Trunk Assessment   Upper Extremity Assessment: Overall WFL for tasks assessed           Lower Extremity Assessment: LLE deficits/detail   LLE Deficits / Details: At least 3/5 throughout  Cervical / Trunk Assessment: Kyphotic  Communication   Communication: No difficulties  Cognition Arousal/Alertness: Awake/alert Behavior During Therapy: WFL for tasks assessed/performed Overall Cognitive Status: Within Functional Limits for tasks assessed                      General Comments      Exercises Total Joint Exercises Ankle Circles/Pumps: AROM;Both;10 reps;Supine Quad Sets: AROM;Both;10 reps;Supine Heel Slides: AAROM;Left;10 reps;Supine Hip ABduction/ADduction: AAROM;Left;10 reps;Supine Straight Leg Raises: AAROM;Left;10 reps;Supine Goniometric ROM: ~10-50 degrees      Assessment/Plan    PT Assessment Patient needs continued PT services  PT Diagnosis Difficulty  walking;Acute pain   PT Problem List Decreased strength;Decreased range of motion;Decreased activity tolerance;Decreased balance;Pain;Decreased mobility;Decreased knowledge of use of DME  PT Treatment Interventions DME instruction;Gait training;Stair training;Functional mobility training;Therapeutic activities;Patient/family education;Balance training;Therapeutic  exercise   PT Goals (Current goals can be found in the Care Plan section) Acute Rehab PT Goals Patient Stated Goal: home soon. regain PLOF PT Goal Formulation: With patient Time For Goal Achievement: 11/09/15 Potential to Achieve Goals: Good    Frequency 7X/week   Barriers to discharge        Co-evaluation               End of Session Equipment Utilized During Treatment: Gait belt Activity Tolerance: Patient tolerated treatment well Patient left: in chair;with call bell/phone within reach           Time: 9381-8299 PT Time Calculation (min) (ACUTE ONLY): 22 min   Charges:   PT Evaluation $PT Eval Moderate Complexity: 1 Procedure     PT G Codes:        Rebeca Alert, MPT Pager: [phone]

## 2015-11-02 NOTE — Care Management Note (Signed)
Case Management Note  Patient Details  Name: [name] MRN: [id number] Date of Birth: [birth year]  Subjective/Objective:                   LEFT TOTAL KNEE ARTHROPLASTY (Left)  Action/Plan: Discharge planning  Expected Discharge Date:  11/02/15               Expected Discharge Plan:  [place]  In-House Referral:     Discharge planning Services  CM Consult  Post Acute Care Choice:  NA Choice offered to:     DME Arranged:  N/A DME Agency:     HH Arranged:  PT HH Agency:  [facility]  Status of Service:  Completed, signed off  Medicare Important Message Given:    Date Medicare IM Given:    Medicare IM give by:    Date Additional Medicare IM Given:    Additional Medicare Important Message give by:     If discussed at [place] of Stay Meetings, dates discussed:    Additional Comments: CM met with pt in room to offer choice of home health agency.  Pt hcooses [facility] to render HHPT.  Pt has both a bedside commode and rolling walker at home.  Referral emailed to [employer], Tim.  No other CM needs were communicated. Dellie Catholic, RN 11/02/2015, 10:21 AM

## 2015-11-02 NOTE — Progress Notes (Signed)
     Subjective: 1 Day Post-Op Procedure(s) (LRB): LEFT TOTAL KNEE ARTHROPLASTY (Left)    Seen by Dr. Charlann Boxer. Patient reports pain as mild, pain controlled.  States that she has some nausea last night, associated with the Oxycodone.  Would like to switch back to Norco, which she has done well on.  Otherwise no events throughout the night.  Ready to be discharged home if her pain stays controlled and she does well with PT.   Objective:   VITALS:   Filed Vitals:   11/02/15 0147 11/02/15 0656  BP: 124/81 133/93  Pulse: 81 80  Temp: 98.3 F (36.8 C) 98 F (36.7 C)  Resp: 18 18    Dorsiflexion/Plantar flexion intact Incision: dressing C/D/I No cellulitis present Compartment soft  LABS  Recent Labs  11/02/15 0414  HGB 10.4*  HCT 32.2*  WBC 11.6*  PLT 244     Recent Labs  11/02/15 0414  NA 145  K 4.4  BUN 18  CREATININE 0.88  GLUCOSE 125*     Assessment/Plan: 1 Day Post-Op Procedure(s) (LRB): LEFT TOTAL KNEE ARTHROPLASTY (Left) Foley cath d/c'ed Advance diet Up with therapy D/C IV fluids Discharge home with home health  Follow up in 2 weeks at [facility]. Follow up with OLIN,Gailene Youkhana D in 2 weeks.  Contact information:  [facility] [address] [place] [address] [phone]           Anastasio Auerbach. Tanmay Halteman   PAC  11/02/2015, 9:19 AM

## 2016-01-21 DIAGNOSIS — Z79899 Other long term (current) drug therapy: Secondary | ICD-10-CM | POA: Diagnosis not present

## 2016-01-21 DIAGNOSIS — L4059 Other psoriatic arthropathy: Secondary | ICD-10-CM | POA: Diagnosis not present

## 2016-01-21 DIAGNOSIS — M25562 Pain in left knee: Secondary | ICD-10-CM | POA: Diagnosis not present

## 2016-01-21 DIAGNOSIS — M2550 Pain in unspecified joint: Secondary | ICD-10-CM | POA: Diagnosis not present

## 2016-04-19 DIAGNOSIS — L409 Psoriasis, unspecified: Secondary | ICD-10-CM | POA: Diagnosis not present

## 2016-04-19 DIAGNOSIS — L570 Actinic keratosis: Secondary | ICD-10-CM | POA: Diagnosis not present

## 2016-04-19 DIAGNOSIS — D225 Melanocytic nevi of trunk: Secondary | ICD-10-CM | POA: Diagnosis not present

## 2016-04-19 DIAGNOSIS — Z85828 Personal history of other malignant neoplasm of skin: Secondary | ICD-10-CM | POA: Diagnosis not present

## 2016-04-21 DIAGNOSIS — M2550 Pain in unspecified joint: Secondary | ICD-10-CM | POA: Diagnosis not present

## 2016-04-21 DIAGNOSIS — L4059 Other psoriatic arthropathy: Secondary | ICD-10-CM | POA: Diagnosis not present

## 2016-04-21 DIAGNOSIS — Z79899 Other long term (current) drug therapy: Secondary | ICD-10-CM | POA: Diagnosis not present

## 2016-05-08 ENCOUNTER — Emergency Department (HOSPITAL_COMMUNITY): Payer: BLUE CROSS/BLUE SHIELD

## 2016-05-08 ENCOUNTER — Encounter (HOSPITAL_COMMUNITY): Payer: Self-pay | Admitting: Emergency Medicine

## 2016-05-08 ENCOUNTER — Emergency Department (HOSPITAL_COMMUNITY)
Admission: EM | Admit: 2016-05-08 | Discharge: 2016-05-08 | Disposition: A | Discharge status: Home / Self Care | Payer: BLUE CROSS/BLUE SHIELD | Attending: Emergency Medicine | Admitting: Emergency Medicine

## 2016-05-08 DIAGNOSIS — M545 Low back pain, unspecified: Secondary | ICD-10-CM

## 2016-05-08 DIAGNOSIS — I10 Essential (primary) hypertension: Secondary | ICD-10-CM | POA: Diagnosis not present

## 2016-05-08 DIAGNOSIS — M47816 Spondylosis without myelopathy or radiculopathy, lumbar region: Secondary | ICD-10-CM | POA: Diagnosis not present

## 2016-05-08 DIAGNOSIS — Z85828 Personal history of other malignant neoplasm of skin: Secondary | ICD-10-CM | POA: Insufficient documentation

## 2016-05-08 DIAGNOSIS — M1990 Unspecified osteoarthritis, unspecified site: Secondary | ICD-10-CM | POA: Diagnosis not present

## 2016-05-08 DIAGNOSIS — M47814 Spondylosis without myelopathy or radiculopathy, thoracic region: Secondary | ICD-10-CM | POA: Diagnosis not present

## 2016-05-08 DIAGNOSIS — Z79899 Other long term (current) drug therapy: Secondary | ICD-10-CM | POA: Diagnosis not present

## 2016-05-08 DIAGNOSIS — Z7952 Long term (current) use of systemic steroids: Secondary | ICD-10-CM | POA: Diagnosis not present

## 2016-05-08 DIAGNOSIS — Z96651 Presence of right artificial knee joint: Secondary | ICD-10-CM | POA: Diagnosis not present

## 2016-05-08 DIAGNOSIS — R52 Pain, unspecified: Secondary | ICD-10-CM

## 2016-05-08 MED ORDER — HYDROCODONE-ACETAMINOPHEN 5-325 MG PO TABS
1.0000 | ORAL_TABLET | Freq: Once | ORAL | Status: AC
  Administered 2016-05-08: 1 via ORAL
  Filled 2016-05-08 (×2): qty 1

## 2016-05-08 MED ORDER — HYDROCODONE-ACETAMINOPHEN 5-325 MG PO TABS: 1.0000 | ORAL_TABLET | Freq: Four times a day (QID) | ORAL | 0 refills | Status: DC | PRN

## 2016-05-08 NOTE — ED Provider Notes (Signed)
[facility]-EMERGENCY DEPT Provider Note   CSN: [id number] Arrival date & time: 05/08/16  9357  First Provider Contact:  First MD Initiated Contact with Patient 05/08/16 1035        History   Chief Complaint Chief Complaint  Patient presents with  . Back Pain    HPI [name] is a 60 y.o. female.  HPI Complains of right-sided para lumbar and parathoracicNonradiating back pain onset 2 days ago pain is worse with changing positions improved with remaining still.No loss of bladder or bowel control no fever no trauma. She treated herself with tramadol, without relief. No other associated symptoms Past Medical History:  Diagnosis Date  . Arthritis   . Arthritis with psoriasis (HCC)   . Bruises easily   . Complication of anesthesia    pt states awaken during operating room during right knee surgery   . Concussion    2015  . Difficulty sleeping   . Fall    2015  . HA (headache)    migraine history secondary to fall in 2015 no current issues  . History of bronchitis    1990's   . History of nonmelanoma skin cancer   . History of urinary tract infection   . Hypertension   . Osteoporosis   . PONV (postoperative nausea and vomiting)   . Psoriasis   . Right atrial enlargement   . Squamous cell carcinoma (HCC)    right ankle   . Wears glasses     Patient Active Problem List   Diagnosis Date Noted  . S/P left TKA 11/01/2015  . Cervical spondylosis with myelopathy 08/06/2014  . Migraine 08/06/2014  . HA (headache)   . Unspecified arthropathy, lower leg 11/14/2013  . Insomnia, unspecified 11/14/2013  . Osteoporosis 11/12/2013  . Hypertension 11/12/2013  . Arthritis 11/12/2013  . Expected blood loss anemia 11/05/2013  . S/P right TKA 11/03/2013    Past Surgical History:  Procedure Laterality Date  . BACK SURGERY  2006   fusion C2-C6   . CESAREAN SECTION    . HAND SURGERY     multiple surgeries due to arthritis  . TOTAL KNEE ARTHROPLASTY Right 11/03/2013   Procedure: RIGHT TOTAL KNEE ARTHROPLASTY;  Surgeon: [name], MD;  Location: [facility];  Service: Orthopedics;  Laterality: Right;  . TOTAL KNEE ARTHROPLASTY Left 11/01/2015   Procedure: LEFT TOTAL KNEE ARTHROPLASTY;  Surgeon: Durene Romans, MD;  Location: [facility];  Service: Orthopedics;  Laterality: Left;    OB History    No data available       Home Medications    Prior to Admission medications   Medication Sig Start Date End Date Taking? Authorizing Provider  atorvastatin (LIPITOR) 20 MG tablet Take 20 mg by mouth daily. 07/24/15   Historical Provider, MD  CALCIUM CARBONATE-VITAMIN D PO Take 1 tablet by mouth daily.    Historical Provider, MD  celecoxib (CELEBREX) 200 MG capsule Take 1 capsule (200 mg total) by mouth every 12 (twelve) hours. 11/02/15   Lanney Gins, PA-C  docusate sodium (COLACE) 100 MG capsule Take 1 capsule (100 mg total) by mouth 2 (two) times daily. 11/02/15   Lanney Gins, PA-C  ferrous sulfate 325 (65 FE) MG tablet Take 1 tablet (325 mg total) by mouth 3 (three) times daily after meals. 11/02/15   Lanney Gins, PA-C  HYDROcodone-acetaminophen (NORCO) 7.5-325 MG tablet Take 1-2 tablets by mouth every 4 (four) hours as needed for moderate pain. 11/02/15   Lanney Gins, PA-C  lisinopril (PRINIVIL,ZESTRIL) 10 MG tablet Take 10 mg by mouth daily with breakfast.    Historical Provider, MD  methocarbamol (ROBAXIN) 500 MG tablet Take 1 tablet (500 mg total) by mouth every 6 (six) hours as needed for muscle spasms. 11/02/15   Lanney Gins, PA-C  Multiple Vitamin (MULTIVITAMIN WITH MINERALS) TABS tablet Take 1 tablet by mouth daily.    Historical Provider, MD  polyethylene glycol (MIRALAX / GLYCOLAX) packet Take 17 g by mouth 2 (two) times daily. 11/02/15   Lanney Gins, PA-C  predniSONE (DELTASONE) 1 MG tablet Take 4 mg by mouth daily. 07/23/15   Historical Provider, MD  zolpidem (AMBIEN CR) 12.5 MG CR tablet Take 12.5 mg by mouth at bedtime as needed for sleep.   08/17/15   Historical Provider, MD    Family History Family History  Problem Relation Age of Onset  . Migraines Mother   . Lupus Sister   . Heart disease Sister     Social History Social History  Substance Use Topics  . Smoking status: Never Smoker  . Smokeless tobacco: Never Used  . Alcohol use Yes     Comment: occasional     Allergies   Other   Review of Systems Review of Systems  Constitutional: Negative.   HENT: Negative.   Respiratory: Negative.   Cardiovascular: Negative.   Gastrointestinal: Negative.   Musculoskeletal: Positive for back pain and gait problem.       Gait slightly unsteady at baseline  Skin: Negative.   Psychiatric/Behavioral: Negative.   All other systems reviewed and are negative.    Physical Exam Updated Vital Signs BP 136/94 (BP Location: Left Arm)   Pulse 84   Temp 97.8 F (36.6 C) (Oral)   Resp 17   SpO2 99%   Physical Exam  Constitutional: She is oriented to person, place, and time. She appears well-developed and well-nourished.  HENT:  Head: Normocephalic and atraumatic.  Eyes: Conjunctivae are normal. Pupils are equal, round, and reactive to light.  Neck: Neck supple. No tracheal deviation present. No thyromegaly present.  Cardiovascular: Normal rate and regular rhythm.   No murmur heard. Pulmonary/Chest: Effort normal and breath sounds normal.  Abdominal: Soft. Bowel sounds are normal. She exhibits no distension. There is no tenderness.  Musculoskeletal: Normal range of motion. She exhibits no edema or tenderness.  Valgus deformity of MCP joints and MTP joints on all 4 extremities. Entire spine nontender. She has pain at right parathoracic and right paralumbar areas on standing from a supine position.  Neurological: She is alert and oriented to person, place, and time. She displays normal reflexes. Coordination normal.  Gait slightly unsteady but walks with out assistance. She states since her normal gait and DTRs symmetric  bilaterally at knee jerk and ankle jerks bilaterally. Toes downgoing bilaterally  Skin: Skin is warm and dry. No rash noted.  Psychiatric: She has a normal mood and affect.  Nursing note and vitals reviewed.    ED Treatments / Results  Labs (all labs ordered are listed, but only abnormal results are displayed) Labs Reviewed - No data to display  EKG  EKG Interpretation None       Radiology No results found.  Procedures Procedures (including critical care time)  Medications Ordered in ED Medications - No data to display  12:50 PM pain improved after treatment with Norco. X-rays viewed by me Results for orders placed or performed during the hospital encounter of 11/01/15  CBC  Result Value Ref Range  WBC 11.6 (H) 4.0 - 10.5 K/uL   RBC 3.54 (L) 3.87 - 5.11 MIL/uL   Hemoglobin 10.4 (L) 12.0 - 15.0 g/dL   HCT 10.2 (L) 58.5 - 27.7 %   MCV 91.0 78.0 - 100.0 fL   MCH 29.4 26.0 - 34.0 pg   MCHC 32.3 30.0 - 36.0 g/dL   RDW 82.4 23.5 - 36.1 %   Platelets 244 150 - 400 K/uL  Basic metabolic panel  Result Value Ref Range   Sodium 145 135 - 145 mmol/L   Potassium 4.4 3.5 - 5.1 mmol/L   Chloride 115 (H) 101 - 111 mmol/L   CO2 24 22 - 32 mmol/L   Glucose, Bld 125 (H) 65 - 99 mg/dL   BUN 18 6 - 20 mg/dL   Creatinine, Ser 4.43 0.44 - 1.00 mg/dL   Calcium 8.4 (L) 8.9 - 10.3 mg/dL   GFR calc non Af Amer >60 >60 mL/min   GFR calc Af Amer >60 >60 mL/min   Anion gap 6 5 - 15   Dg Thoracic Spine W/swimmers  Result Date: 05/08/2016 CLINICAL DATA:  Dorsalgia for 2 days EXAM: THORACIC SPINE - 3 VIEWS COMPARISON:  Chest radiograph October 28, 2013 FINDINGS: Frontal, lateral, and swimmer's views were obtained. There is mid thoracic dextroscoliosis with upper lumbar levoscoliosis. There is no fracture or spondylolisthesis. There is moderately severe disc space narrowing at several levels in the upper to mid thoracic region. There is no erosive change or paraspinous lesion. There is  extensive postoperative change in the posterior cervical spine. IMPRESSION: Multifocal osteoarthritic change. Areas of scoliosis. No fracture or spondylolisthesis. Electronically Signed   By: Bretta Bang III M.D.   On: 05/08/2016 11:36  Dg Lumbar Spine Complete  Result Date: 05/08/2016 CLINICAL DATA:  Two day history of progressive lumbago EXAM: LUMBAR SPINE - COMPLETE 4+ VIEW COMPARISON:  May 25, 2008 FINDINGS: Frontal, lateral, spot lumbosacral lateral, and bilateral oblique views were obtained. There are 5 non-rib-bearing lumbar type vertebral bodies. There is localized mid lumbar dextroscoliosis. There is no fracture or spondylolisthesis. There is severe disc space narrowing at L2-3 and L3-4, stable. Other disc spaces appear unremarkable. There is multilevel facet osteoarthritic change, most marked at L2-3, L3-4, and L5-S1 bilaterally. IMPRESSION: Osteoarthritic change, stable from prior study. Osteoarthritic change is most pronounced in the mid lumbar region. No fracture or spondylolisthesis. Electronically Signed   By: Bretta Bang III M.D.   On: 05/08/2016 11:38  Initial Impression / Assessment and Plan / ED Course  I have reviewed the triage vital signs and the nursing notes.  Pertinent labs & imaging results that were available during my care of the patient were reviewed by me and considered in my medical decision making (see chart for details).  Clinical Course     Plan prescription Norco. Follow-up with Dr. Katrinka Blazing if significant pain by next week. Pain felt to be musculoskeletal in etiology Final Clinical Impressions(s) / ED Diagnoses  Diagnosis back pain Final diagnoses:  None   Diagnosis New Prescriptions New Prescriptions   No medications on file     Doug Sou, MD 05/08/16 1257

## 2016-05-08 NOTE — Discharge Instructions (Signed)
Take Tylenol or tramadol for mild pain or the pain medicine prescribed for bad pain. Don't take Tylenol together with the pain medicine prescribed as the combination can be dangerous to your liver. Take Senokot if you become constipated. The pain medicine prescribed can cause constipation. Call Dr. Katrinka Blazing to be seen in the office if having significant pain by next week

## 2016-05-08 NOTE — ED Notes (Signed)
Patient transported to X-ray 

## 2016-05-08 NOTE — ED Notes (Signed)
Pt comes in with c/o of R. Sided back pain. P/t states onset of pain was Saturday evening and has gradually increased. Current pain at 10/10 upon assessment. Pain is exaggerated with movement or when lying on back.

## 2016-05-08 NOTE — ED Triage Notes (Addendum)
Patient here from home with complaints of mid lower back pain 10/10. Denies injury. Reports nausea. Taken Tramadol with no relief. Ambulatory.

## 2016-05-09 DIAGNOSIS — Z79899 Other long term (current) drug therapy: Secondary | ICD-10-CM | POA: Diagnosis not present

## 2016-05-09 DIAGNOSIS — S29019A Strain of muscle and tendon of unspecified wall of thorax, initial encounter: Secondary | ICD-10-CM | POA: Diagnosis not present

## 2016-05-09 DIAGNOSIS — L4059 Other psoriatic arthropathy: Secondary | ICD-10-CM | POA: Diagnosis not present

## 2016-05-09 DIAGNOSIS — M2550 Pain in unspecified joint: Secondary | ICD-10-CM | POA: Diagnosis not present

## 2016-07-25 DIAGNOSIS — L4059 Other psoriatic arthropathy: Secondary | ICD-10-CM | POA: Diagnosis not present

## 2016-07-25 DIAGNOSIS — Z23 Encounter for immunization: Secondary | ICD-10-CM | POA: Diagnosis not present

## 2016-07-25 DIAGNOSIS — M2550 Pain in unspecified joint: Secondary | ICD-10-CM | POA: Diagnosis not present

## 2016-07-25 DIAGNOSIS — Z79899 Other long term (current) drug therapy: Secondary | ICD-10-CM | POA: Diagnosis not present

## 2016-09-12 DIAGNOSIS — H5213 Myopia, bilateral: Secondary | ICD-10-CM | POA: Diagnosis not present

## 2016-09-12 DIAGNOSIS — H52203 Unspecified astigmatism, bilateral: Secondary | ICD-10-CM | POA: Diagnosis not present

## 2016-09-20 DIAGNOSIS — Z23 Encounter for immunization: Secondary | ICD-10-CM | POA: Diagnosis not present

## 2016-09-20 DIAGNOSIS — Z0000 Encounter for general adult medical examination without abnormal findings: Secondary | ICD-10-CM | POA: Diagnosis not present

## 2016-09-20 DIAGNOSIS — E7800 Pure hypercholesterolemia, unspecified: Secondary | ICD-10-CM | POA: Diagnosis not present

## 2016-09-20 DIAGNOSIS — G4700 Insomnia, unspecified: Secondary | ICD-10-CM | POA: Diagnosis not present

## 2016-09-20 DIAGNOSIS — M8580 Other specified disorders of bone density and structure, unspecified site: Secondary | ICD-10-CM | POA: Diagnosis not present

## 2016-09-20 DIAGNOSIS — Z1211 Encounter for screening for malignant neoplasm of colon: Secondary | ICD-10-CM | POA: Diagnosis not present

## 2016-09-20 DIAGNOSIS — I10 Essential (primary) hypertension: Secondary | ICD-10-CM | POA: Diagnosis not present

## 2016-10-24 DIAGNOSIS — L570 Actinic keratosis: Secondary | ICD-10-CM | POA: Diagnosis not present

## 2016-10-24 DIAGNOSIS — B079 Viral wart, unspecified: Secondary | ICD-10-CM | POA: Diagnosis not present

## 2016-10-25 DIAGNOSIS — L4059 Other psoriatic arthropathy: Secondary | ICD-10-CM | POA: Diagnosis not present

## 2016-10-25 DIAGNOSIS — M2550 Pain in unspecified joint: Secondary | ICD-10-CM | POA: Diagnosis not present

## 2016-10-25 DIAGNOSIS — Z79899 Other long term (current) drug therapy: Secondary | ICD-10-CM | POA: Diagnosis not present

## 2016-11-28 DIAGNOSIS — N342 Other urethritis: Secondary | ICD-10-CM | POA: Diagnosis not present

## 2016-11-28 DIAGNOSIS — Z6824 Body mass index (BMI) 24.0-24.9, adult: Secondary | ICD-10-CM | POA: Diagnosis not present

## 2016-11-28 DIAGNOSIS — Z124 Encounter for screening for malignant neoplasm of cervix: Secondary | ICD-10-CM | POA: Diagnosis not present

## 2016-11-28 DIAGNOSIS — Z01419 Encounter for gynecological examination (general) (routine) without abnormal findings: Secondary | ICD-10-CM | POA: Diagnosis not present

## 2016-12-14 DIAGNOSIS — Z6824 Body mass index (BMI) 24.0-24.9, adult: Secondary | ICD-10-CM | POA: Diagnosis not present

## 2016-12-14 DIAGNOSIS — Z1231 Encounter for screening mammogram for malignant neoplasm of breast: Secondary | ICD-10-CM | POA: Diagnosis not present

## 2017-01-25 DIAGNOSIS — L570 Actinic keratosis: Secondary | ICD-10-CM | POA: Diagnosis not present

## 2017-01-25 DIAGNOSIS — Z79899 Other long term (current) drug therapy: Secondary | ICD-10-CM | POA: Diagnosis not present

## 2017-01-25 DIAGNOSIS — M2550 Pain in unspecified joint: Secondary | ICD-10-CM | POA: Diagnosis not present

## 2017-01-25 DIAGNOSIS — L4059 Other psoriatic arthropathy: Secondary | ICD-10-CM | POA: Diagnosis not present

## 2017-03-08 DIAGNOSIS — M8588 Other specified disorders of bone density and structure, other site: Secondary | ICD-10-CM | POA: Diagnosis not present

## 2017-03-08 DIAGNOSIS — M810 Age-related osteoporosis without current pathological fracture: Secondary | ICD-10-CM | POA: Diagnosis not present

## 2017-03-20 DIAGNOSIS — M818 Other osteoporosis without current pathological fracture: Secondary | ICD-10-CM | POA: Diagnosis not present

## 2017-03-20 DIAGNOSIS — Z23 Encounter for immunization: Secondary | ICD-10-CM | POA: Diagnosis not present

## 2017-03-20 DIAGNOSIS — G4700 Insomnia, unspecified: Secondary | ICD-10-CM | POA: Diagnosis not present

## 2017-03-20 DIAGNOSIS — I10 Essential (primary) hypertension: Secondary | ICD-10-CM | POA: Diagnosis not present

## 2017-03-20 DIAGNOSIS — E7800 Pure hypercholesterolemia, unspecified: Secondary | ICD-10-CM | POA: Diagnosis not present

## 2017-04-26 DIAGNOSIS — D0461 Carcinoma in situ of skin of right upper limb, including shoulder: Secondary | ICD-10-CM | POA: Diagnosis not present

## 2017-04-26 DIAGNOSIS — L570 Actinic keratosis: Secondary | ICD-10-CM | POA: Diagnosis not present

## 2017-04-26 DIAGNOSIS — M810 Age-related osteoporosis without current pathological fracture: Secondary | ICD-10-CM | POA: Diagnosis not present

## 2017-04-26 DIAGNOSIS — Z85828 Personal history of other malignant neoplasm of skin: Secondary | ICD-10-CM | POA: Diagnosis not present

## 2017-04-26 DIAGNOSIS — M2550 Pain in unspecified joint: Secondary | ICD-10-CM | POA: Diagnosis not present

## 2017-04-26 DIAGNOSIS — L4059 Other psoriatic arthropathy: Secondary | ICD-10-CM | POA: Diagnosis not present

## 2017-04-26 DIAGNOSIS — L821 Other seborrheic keratosis: Secondary | ICD-10-CM | POA: Diagnosis not present

## 2017-04-26 DIAGNOSIS — D485 Neoplasm of uncertain behavior of skin: Secondary | ICD-10-CM | POA: Diagnosis not present

## 2017-04-26 DIAGNOSIS — Z79899 Other long term (current) drug therapy: Secondary | ICD-10-CM | POA: Diagnosis not present

## 2017-05-30 DIAGNOSIS — M8580 Other specified disorders of bone density and structure, unspecified site: Secondary | ICD-10-CM | POA: Diagnosis not present

## 2017-05-30 DIAGNOSIS — D0461 Carcinoma in situ of skin of right upper limb, including shoulder: Secondary | ICD-10-CM | POA: Diagnosis not present

## 2017-08-02 DIAGNOSIS — M810 Age-related osteoporosis without current pathological fracture: Secondary | ICD-10-CM | POA: Diagnosis not present

## 2017-08-02 DIAGNOSIS — M2550 Pain in unspecified joint: Secondary | ICD-10-CM | POA: Diagnosis not present

## 2017-08-02 DIAGNOSIS — Z23 Encounter for immunization: Secondary | ICD-10-CM | POA: Diagnosis not present

## 2017-08-02 DIAGNOSIS — L4059 Other psoriatic arthropathy: Secondary | ICD-10-CM | POA: Diagnosis not present

## 2017-09-27 DIAGNOSIS — I10 Essential (primary) hypertension: Secondary | ICD-10-CM | POA: Diagnosis not present

## 2017-09-27 DIAGNOSIS — E7800 Pure hypercholesterolemia, unspecified: Secondary | ICD-10-CM | POA: Diagnosis not present

## 2017-09-27 DIAGNOSIS — M818 Other osteoporosis without current pathological fracture: Secondary | ICD-10-CM | POA: Diagnosis not present

## 2017-09-27 DIAGNOSIS — Z0000 Encounter for general adult medical examination without abnormal findings: Secondary | ICD-10-CM | POA: Diagnosis not present

## 2017-11-06 DIAGNOSIS — L570 Actinic keratosis: Secondary | ICD-10-CM | POA: Diagnosis not present

## 2017-11-06 DIAGNOSIS — Z85828 Personal history of other malignant neoplasm of skin: Secondary | ICD-10-CM | POA: Diagnosis not present

## 2017-11-06 DIAGNOSIS — D225 Melanocytic nevi of trunk: Secondary | ICD-10-CM | POA: Diagnosis not present

## 2017-11-06 DIAGNOSIS — L4059 Other psoriatic arthropathy: Secondary | ICD-10-CM | POA: Diagnosis not present

## 2017-11-06 DIAGNOSIS — Z79899 Other long term (current) drug therapy: Secondary | ICD-10-CM | POA: Diagnosis not present

## 2017-11-06 DIAGNOSIS — M2550 Pain in unspecified joint: Secondary | ICD-10-CM | POA: Diagnosis not present

## 2017-11-06 DIAGNOSIS — D485 Neoplasm of uncertain behavior of skin: Secondary | ICD-10-CM | POA: Diagnosis not present

## 2017-12-06 DIAGNOSIS — I10 Essential (primary) hypertension: Secondary | ICD-10-CM | POA: Diagnosis not present

## 2017-12-06 DIAGNOSIS — M810 Age-related osteoporosis without current pathological fracture: Secondary | ICD-10-CM | POA: Diagnosis not present

## 2017-12-06 DIAGNOSIS — M818 Other osteoporosis without current pathological fracture: Secondary | ICD-10-CM | POA: Diagnosis not present

## 2017-12-06 DIAGNOSIS — E211 Secondary hyperparathyroidism, not elsewhere classified: Secondary | ICD-10-CM | POA: Diagnosis not present

## 2017-12-06 DIAGNOSIS — E559 Vitamin D deficiency, unspecified: Secondary | ICD-10-CM | POA: Diagnosis not present

## 2017-12-20 DIAGNOSIS — L988 Other specified disorders of the skin and subcutaneous tissue: Secondary | ICD-10-CM | POA: Diagnosis not present

## 2017-12-20 DIAGNOSIS — D485 Neoplasm of uncertain behavior of skin: Secondary | ICD-10-CM | POA: Diagnosis not present

## 2017-12-25 DIAGNOSIS — Z124 Encounter for screening for malignant neoplasm of cervix: Secondary | ICD-10-CM | POA: Diagnosis not present

## 2017-12-25 DIAGNOSIS — Z01419 Encounter for gynecological examination (general) (routine) without abnormal findings: Secondary | ICD-10-CM | POA: Diagnosis not present

## 2017-12-25 DIAGNOSIS — Z6824 Body mass index (BMI) 24.0-24.9, adult: Secondary | ICD-10-CM | POA: Diagnosis not present

## 2017-12-25 DIAGNOSIS — N959 Unspecified menopausal and perimenopausal disorder: Secondary | ICD-10-CM | POA: Diagnosis not present

## 2017-12-25 DIAGNOSIS — Z1231 Encounter for screening mammogram for malignant neoplasm of breast: Secondary | ICD-10-CM | POA: Diagnosis not present

## 2018-02-07 DIAGNOSIS — L4059 Other psoriatic arthropathy: Secondary | ICD-10-CM | POA: Diagnosis not present

## 2018-02-07 DIAGNOSIS — M2550 Pain in unspecified joint: Secondary | ICD-10-CM | POA: Diagnosis not present

## 2018-02-07 DIAGNOSIS — Z79899 Other long term (current) drug therapy: Secondary | ICD-10-CM | POA: Diagnosis not present

## 2018-03-26 DIAGNOSIS — Z1211 Encounter for screening for malignant neoplasm of colon: Secondary | ICD-10-CM | POA: Diagnosis not present

## 2018-03-26 DIAGNOSIS — L4050 Arthropathic psoriasis, unspecified: Secondary | ICD-10-CM | POA: Diagnosis not present

## 2018-03-26 DIAGNOSIS — M818 Other osteoporosis without current pathological fracture: Secondary | ICD-10-CM | POA: Diagnosis not present

## 2018-03-26 DIAGNOSIS — E7800 Pure hypercholesterolemia, unspecified: Secondary | ICD-10-CM | POA: Diagnosis not present

## 2018-03-26 DIAGNOSIS — I10 Essential (primary) hypertension: Secondary | ICD-10-CM | POA: Diagnosis not present

## 2018-03-29 DIAGNOSIS — L905 Scar conditions and fibrosis of skin: Secondary | ICD-10-CM | POA: Diagnosis not present

## 2018-03-29 DIAGNOSIS — D225 Melanocytic nevi of trunk: Secondary | ICD-10-CM | POA: Diagnosis not present

## 2018-03-29 DIAGNOSIS — L821 Other seborrheic keratosis: Secondary | ICD-10-CM | POA: Diagnosis not present

## 2018-03-29 DIAGNOSIS — L219 Seborrheic dermatitis, unspecified: Secondary | ICD-10-CM | POA: Diagnosis not present

## 2018-03-29 DIAGNOSIS — B078 Other viral warts: Secondary | ICD-10-CM | POA: Diagnosis not present

## 2018-03-29 DIAGNOSIS — L570 Actinic keratosis: Secondary | ICD-10-CM | POA: Diagnosis not present

## 2018-03-29 DIAGNOSIS — D485 Neoplasm of uncertain behavior of skin: Secondary | ICD-10-CM | POA: Diagnosis not present

## 2018-04-02 DIAGNOSIS — Z6824 Body mass index (BMI) 24.0-24.9, adult: Secondary | ICD-10-CM | POA: Diagnosis not present

## 2018-04-02 DIAGNOSIS — N950 Postmenopausal bleeding: Secondary | ICD-10-CM | POA: Diagnosis not present

## 2018-05-09 DIAGNOSIS — Z79899 Other long term (current) drug therapy: Secondary | ICD-10-CM | POA: Diagnosis not present

## 2018-05-09 DIAGNOSIS — L4059 Other psoriatic arthropathy: Secondary | ICD-10-CM | POA: Diagnosis not present

## 2018-05-09 DIAGNOSIS — M2550 Pain in unspecified joint: Secondary | ICD-10-CM | POA: Diagnosis not present

## 2018-06-06 DIAGNOSIS — M818 Other osteoporosis without current pathological fracture: Secondary | ICD-10-CM | POA: Diagnosis not present

## 2018-07-25 DIAGNOSIS — M2550 Pain in unspecified joint: Secondary | ICD-10-CM | POA: Diagnosis not present

## 2018-07-25 DIAGNOSIS — Z79899 Other long term (current) drug therapy: Secondary | ICD-10-CM | POA: Diagnosis not present

## 2018-07-25 DIAGNOSIS — L4059 Other psoriatic arthropathy: Secondary | ICD-10-CM | POA: Diagnosis not present

## 2018-08-20 DIAGNOSIS — L4059 Other psoriatic arthropathy: Secondary | ICD-10-CM | POA: Diagnosis not present

## 2018-08-20 DIAGNOSIS — Z79899 Other long term (current) drug therapy: Secondary | ICD-10-CM | POA: Diagnosis not present

## 2018-08-20 DIAGNOSIS — M5412 Radiculopathy, cervical region: Secondary | ICD-10-CM | POA: Diagnosis not present

## 2018-08-20 DIAGNOSIS — M2550 Pain in unspecified joint: Secondary | ICD-10-CM | POA: Diagnosis not present

## 2018-08-28 DIAGNOSIS — S29012D Strain of muscle and tendon of back wall of thorax, subsequent encounter: Secondary | ICD-10-CM | POA: Diagnosis not present

## 2018-08-28 DIAGNOSIS — M542 Cervicalgia: Secondary | ICD-10-CM | POA: Diagnosis not present

## 2018-08-28 DIAGNOSIS — S161XXD Strain of muscle, fascia and tendon at neck level, subsequent encounter: Secondary | ICD-10-CM | POA: Diagnosis not present

## 2018-08-30 DIAGNOSIS — M542 Cervicalgia: Secondary | ICD-10-CM | POA: Diagnosis not present

## 2018-08-30 DIAGNOSIS — S29012D Strain of muscle and tendon of back wall of thorax, subsequent encounter: Secondary | ICD-10-CM | POA: Diagnosis not present

## 2018-08-30 DIAGNOSIS — S161XXD Strain of muscle, fascia and tendon at neck level, subsequent encounter: Secondary | ICD-10-CM | POA: Diagnosis not present

## 2018-09-02 DIAGNOSIS — S161XXD Strain of muscle, fascia and tendon at neck level, subsequent encounter: Secondary | ICD-10-CM | POA: Diagnosis not present

## 2018-09-02 DIAGNOSIS — M542 Cervicalgia: Secondary | ICD-10-CM | POA: Diagnosis not present

## 2018-09-02 DIAGNOSIS — S29012D Strain of muscle and tendon of back wall of thorax, subsequent encounter: Secondary | ICD-10-CM | POA: Diagnosis not present

## 2018-09-04 DIAGNOSIS — S29012D Strain of muscle and tendon of back wall of thorax, subsequent encounter: Secondary | ICD-10-CM | POA: Diagnosis not present

## 2018-09-04 DIAGNOSIS — S161XXD Strain of muscle, fascia and tendon at neck level, subsequent encounter: Secondary | ICD-10-CM | POA: Diagnosis not present

## 2018-09-04 DIAGNOSIS — M542 Cervicalgia: Secondary | ICD-10-CM | POA: Diagnosis not present

## 2018-09-10 DIAGNOSIS — S161XXD Strain of muscle, fascia and tendon at neck level, subsequent encounter: Secondary | ICD-10-CM | POA: Diagnosis not present

## 2018-09-10 DIAGNOSIS — M542 Cervicalgia: Secondary | ICD-10-CM | POA: Diagnosis not present

## 2018-09-10 DIAGNOSIS — S29012D Strain of muscle and tendon of back wall of thorax, subsequent encounter: Secondary | ICD-10-CM | POA: Diagnosis not present

## 2018-09-17 DIAGNOSIS — S29012D Strain of muscle and tendon of back wall of thorax, subsequent encounter: Secondary | ICD-10-CM | POA: Diagnosis not present

## 2018-09-17 DIAGNOSIS — S161XXD Strain of muscle, fascia and tendon at neck level, subsequent encounter: Secondary | ICD-10-CM | POA: Diagnosis not present

## 2018-09-17 DIAGNOSIS — M542 Cervicalgia: Secondary | ICD-10-CM | POA: Diagnosis not present

## 2018-09-23 DIAGNOSIS — I10 Essential (primary) hypertension: Secondary | ICD-10-CM | POA: Diagnosis not present

## 2018-09-23 DIAGNOSIS — E7800 Pure hypercholesterolemia, unspecified: Secondary | ICD-10-CM | POA: Diagnosis not present

## 2018-09-24 DIAGNOSIS — M542 Cervicalgia: Secondary | ICD-10-CM | POA: Diagnosis not present

## 2018-09-24 DIAGNOSIS — S161XXD Strain of muscle, fascia and tendon at neck level, subsequent encounter: Secondary | ICD-10-CM | POA: Diagnosis not present

## 2018-09-24 DIAGNOSIS — S29012D Strain of muscle and tendon of back wall of thorax, subsequent encounter: Secondary | ICD-10-CM | POA: Diagnosis not present

## 2018-09-30 DIAGNOSIS — E7800 Pure hypercholesterolemia, unspecified: Secondary | ICD-10-CM | POA: Diagnosis not present

## 2018-09-30 DIAGNOSIS — Z0001 Encounter for general adult medical examination with abnormal findings: Secondary | ICD-10-CM | POA: Diagnosis not present

## 2018-09-30 DIAGNOSIS — I10 Essential (primary) hypertension: Secondary | ICD-10-CM | POA: Diagnosis not present

## 2018-09-30 DIAGNOSIS — G4700 Insomnia, unspecified: Secondary | ICD-10-CM | POA: Diagnosis not present

## 2018-09-30 DIAGNOSIS — N939 Abnormal uterine and vaginal bleeding, unspecified: Secondary | ICD-10-CM | POA: Diagnosis not present

## 2018-10-25 DIAGNOSIS — L4059 Other psoriatic arthropathy: Secondary | ICD-10-CM | POA: Diagnosis not present

## 2018-10-25 DIAGNOSIS — Z79899 Other long term (current) drug therapy: Secondary | ICD-10-CM | POA: Diagnosis not present

## 2018-10-25 DIAGNOSIS — M2550 Pain in unspecified joint: Secondary | ICD-10-CM | POA: Diagnosis not present

## 2018-10-31 DIAGNOSIS — H524 Presbyopia: Secondary | ICD-10-CM | POA: Diagnosis not present

## 2018-10-31 DIAGNOSIS — H43812 Vitreous degeneration, left eye: Secondary | ICD-10-CM | POA: Diagnosis not present

## 2018-10-31 DIAGNOSIS — H52203 Unspecified astigmatism, bilateral: Secondary | ICD-10-CM | POA: Diagnosis not present

## 2018-10-31 DIAGNOSIS — H5213 Myopia, bilateral: Secondary | ICD-10-CM | POA: Diagnosis not present

## 2018-11-19 DIAGNOSIS — N952 Postmenopausal atrophic vaginitis: Secondary | ICD-10-CM | POA: Diagnosis not present

## 2018-11-19 DIAGNOSIS — R3989 Other symptoms and signs involving the genitourinary system: Secondary | ICD-10-CM | POA: Diagnosis not present

## 2018-11-19 DIAGNOSIS — Z8742 Personal history of other diseases of the female genital tract: Secondary | ICD-10-CM | POA: Diagnosis not present

## 2018-11-19 DIAGNOSIS — N950 Postmenopausal bleeding: Secondary | ICD-10-CM | POA: Diagnosis not present

## 2018-11-22 DIAGNOSIS — Z85828 Personal history of other malignant neoplasm of skin: Secondary | ICD-10-CM | POA: Diagnosis not present

## 2018-11-22 DIAGNOSIS — Z23 Encounter for immunization: Secondary | ICD-10-CM | POA: Diagnosis not present

## 2018-11-22 DIAGNOSIS — L570 Actinic keratosis: Secondary | ICD-10-CM | POA: Diagnosis not present

## 2018-11-22 DIAGNOSIS — L814 Other melanin hyperpigmentation: Secondary | ICD-10-CM | POA: Diagnosis not present

## 2018-11-22 DIAGNOSIS — L309 Dermatitis, unspecified: Secondary | ICD-10-CM | POA: Diagnosis not present

## 2018-12-10 DIAGNOSIS — M818 Other osteoporosis without current pathological fracture: Secondary | ICD-10-CM | POA: Diagnosis not present

## 2018-12-16 DIAGNOSIS — R319 Hematuria, unspecified: Secondary | ICD-10-CM | POA: Diagnosis not present

## 2019-02-25 DIAGNOSIS — M2550 Pain in unspecified joint: Secondary | ICD-10-CM | POA: Diagnosis not present

## 2019-02-25 DIAGNOSIS — Z79899 Other long term (current) drug therapy: Secondary | ICD-10-CM | POA: Diagnosis not present

## 2019-04-01 DIAGNOSIS — G4700 Insomnia, unspecified: Secondary | ICD-10-CM | POA: Diagnosis not present

## 2019-04-01 DIAGNOSIS — L4050 Arthropathic psoriasis, unspecified: Secondary | ICD-10-CM | POA: Diagnosis not present

## 2019-04-01 DIAGNOSIS — E7800 Pure hypercholesterolemia, unspecified: Secondary | ICD-10-CM | POA: Diagnosis not present

## 2019-04-01 DIAGNOSIS — I10 Essential (primary) hypertension: Secondary | ICD-10-CM | POA: Diagnosis not present

## 2019-04-02 ENCOUNTER — Other Ambulatory Visit: Payer: Self-pay | Admitting: Obstetrics and Gynecology

## 2019-04-02 ENCOUNTER — Other Ambulatory Visit (HOSPITAL_COMMUNITY)
Admission: RE | Admit: 2019-04-02 | Discharge: 2019-04-02 | Disposition: A | Discharge status: Home / Self Care | Payer: BC Managed Care – PPO | Source: Ambulatory Visit | Attending: Obstetrics and Gynecology | Admitting: Obstetrics and Gynecology

## 2019-04-02 DIAGNOSIS — Z01419 Encounter for gynecological examination (general) (routine) without abnormal findings: Secondary | ICD-10-CM | POA: Insufficient documentation

## 2019-04-02 DIAGNOSIS — I10 Essential (primary) hypertension: Secondary | ICD-10-CM | POA: Diagnosis not present

## 2019-04-04 LAB — CYTOLOGY - PAP
Diagnosis: NEGATIVE
HPV: NOT DETECTED

## 2019-04-22 ENCOUNTER — Other Ambulatory Visit: Payer: Self-pay | Admitting: Obstetrics and Gynecology

## 2019-04-22 DIAGNOSIS — Z1231 Encounter for screening mammogram for malignant neoplasm of breast: Secondary | ICD-10-CM

## 2019-05-20 DIAGNOSIS — B079 Viral wart, unspecified: Secondary | ICD-10-CM | POA: Diagnosis not present

## 2019-05-20 DIAGNOSIS — Z85828 Personal history of other malignant neoplasm of skin: Secondary | ICD-10-CM | POA: Diagnosis not present

## 2019-05-20 DIAGNOSIS — L219 Seborrheic dermatitis, unspecified: Secondary | ICD-10-CM | POA: Diagnosis not present

## 2019-05-20 DIAGNOSIS — L821 Other seborrheic keratosis: Secondary | ICD-10-CM | POA: Diagnosis not present

## 2019-05-20 DIAGNOSIS — L570 Actinic keratosis: Secondary | ICD-10-CM | POA: Diagnosis not present

## 2019-05-20 DIAGNOSIS — D485 Neoplasm of uncertain behavior of skin: Secondary | ICD-10-CM | POA: Diagnosis not present

## 2019-05-21 DIAGNOSIS — M2550 Pain in unspecified joint: Secondary | ICD-10-CM | POA: Diagnosis not present

## 2019-05-21 DIAGNOSIS — L4059 Other psoriatic arthropathy: Secondary | ICD-10-CM | POA: Diagnosis not present

## 2019-05-21 DIAGNOSIS — Z79899 Other long term (current) drug therapy: Secondary | ICD-10-CM | POA: Diagnosis not present

## 2019-06-06 ENCOUNTER — Ambulatory Visit
Admission: RE | Admit: 2019-06-06 | Discharge: 2019-06-06 | Disposition: A | Discharge status: Home / Self Care | Payer: BC Managed Care – PPO | Source: Ambulatory Visit | Attending: Obstetrics and Gynecology | Admitting: Obstetrics and Gynecology

## 2019-06-06 ENCOUNTER — Other Ambulatory Visit: Payer: Self-pay

## 2019-06-06 DIAGNOSIS — Z1231 Encounter for screening mammogram for malignant neoplasm of breast: Secondary | ICD-10-CM

## 2019-06-10 DIAGNOSIS — R7989 Other specified abnormal findings of blood chemistry: Secondary | ICD-10-CM | POA: Diagnosis not present

## 2019-06-11 DIAGNOSIS — M810 Age-related osteoporosis without current pathological fracture: Secondary | ICD-10-CM | POA: Diagnosis not present

## 2019-07-14 DIAGNOSIS — R7989 Other specified abnormal findings of blood chemistry: Secondary | ICD-10-CM | POA: Diagnosis not present

## 2019-07-23 DIAGNOSIS — Z23 Encounter for immunization: Secondary | ICD-10-CM | POA: Diagnosis not present

## 2019-08-05 DIAGNOSIS — Z23 Encounter for immunization: Secondary | ICD-10-CM | POA: Diagnosis not present

## 2019-08-07 DIAGNOSIS — M62838 Other muscle spasm: Secondary | ICD-10-CM | POA: Diagnosis not present

## 2019-08-20 DIAGNOSIS — L570 Actinic keratosis: Secondary | ICD-10-CM | POA: Diagnosis not present

## 2019-08-21 DIAGNOSIS — L4059 Other psoriatic arthropathy: Secondary | ICD-10-CM | POA: Diagnosis not present

## 2019-08-21 DIAGNOSIS — M2550 Pain in unspecified joint: Secondary | ICD-10-CM | POA: Diagnosis not present

## 2019-08-21 DIAGNOSIS — Z79899 Other long term (current) drug therapy: Secondary | ICD-10-CM | POA: Diagnosis not present

## 2019-08-27 ENCOUNTER — Other Ambulatory Visit: Payer: Self-pay | Admitting: Rheumatology

## 2019-08-27 DIAGNOSIS — R7989 Other specified abnormal findings of blood chemistry: Secondary | ICD-10-CM

## 2019-09-03 DIAGNOSIS — Z01818 Encounter for other preprocedural examination: Secondary | ICD-10-CM | POA: Diagnosis not present

## 2019-09-03 DIAGNOSIS — Z1211 Encounter for screening for malignant neoplasm of colon: Secondary | ICD-10-CM | POA: Diagnosis not present

## 2019-09-03 DIAGNOSIS — R110 Nausea: Secondary | ICD-10-CM | POA: Diagnosis not present

## 2019-09-15 ENCOUNTER — Ambulatory Visit
Admission: RE | Admit: 2019-09-15 | Discharge: 2019-09-15 | Disposition: A | Discharge status: Home / Self Care | Payer: BC Managed Care – PPO | Source: Ambulatory Visit | Attending: Rheumatology | Admitting: Rheumatology

## 2019-09-15 DIAGNOSIS — R945 Abnormal results of liver function studies: Secondary | ICD-10-CM | POA: Diagnosis not present

## 2019-09-15 DIAGNOSIS — R7989 Other specified abnormal findings of blood chemistry: Secondary | ICD-10-CM

## 2019-10-23 DIAGNOSIS — E7800 Pure hypercholesterolemia, unspecified: Secondary | ICD-10-CM | POA: Diagnosis not present

## 2019-10-23 DIAGNOSIS — Z0000 Encounter for general adult medical examination without abnormal findings: Secondary | ICD-10-CM | POA: Diagnosis not present

## 2019-10-23 DIAGNOSIS — I10 Essential (primary) hypertension: Secondary | ICD-10-CM | POA: Diagnosis not present

## 2019-10-23 DIAGNOSIS — G4700 Insomnia, unspecified: Secondary | ICD-10-CM | POA: Diagnosis not present

## 2019-10-23 DIAGNOSIS — M818 Other osteoporosis without current pathological fracture: Secondary | ICD-10-CM | POA: Diagnosis not present

## 2019-11-21 DIAGNOSIS — L570 Actinic keratosis: Secondary | ICD-10-CM | POA: Diagnosis not present

## 2019-11-21 DIAGNOSIS — Z85828 Personal history of other malignant neoplasm of skin: Secondary | ICD-10-CM | POA: Diagnosis not present

## 2019-11-21 DIAGNOSIS — L821 Other seborrheic keratosis: Secondary | ICD-10-CM | POA: Diagnosis not present

## 2019-12-15 DIAGNOSIS — M810 Age-related osteoporosis without current pathological fracture: Secondary | ICD-10-CM | POA: Diagnosis not present

## 2019-12-22 DIAGNOSIS — M2550 Pain in unspecified joint: Secondary | ICD-10-CM | POA: Diagnosis not present

## 2019-12-22 DIAGNOSIS — Z79899 Other long term (current) drug therapy: Secondary | ICD-10-CM | POA: Diagnosis not present

## 2019-12-22 DIAGNOSIS — M810 Age-related osteoporosis without current pathological fracture: Secondary | ICD-10-CM | POA: Diagnosis not present

## 2019-12-22 DIAGNOSIS — L4059 Other psoriatic arthropathy: Secondary | ICD-10-CM | POA: Diagnosis not present

## 2020-01-06 DIAGNOSIS — E7800 Pure hypercholesterolemia, unspecified: Secondary | ICD-10-CM | POA: Diagnosis not present

## 2020-01-06 DIAGNOSIS — I10 Essential (primary) hypertension: Secondary | ICD-10-CM | POA: Diagnosis not present

## 2020-01-13 IMAGING — US US ABDOMEN COMPLETE
1 series · 13 of 25 positions shown · non-contrast
Comparison: None.

CLINICAL DATA: Elevated liver function studies. History of
hypertension.

EXAM:
ABDOMEN ULTRASOUND COMPLETE

[Series 1: us abdomen complete · 0.17mm/px · 13 of 77 slices shown]
[im 1/77]
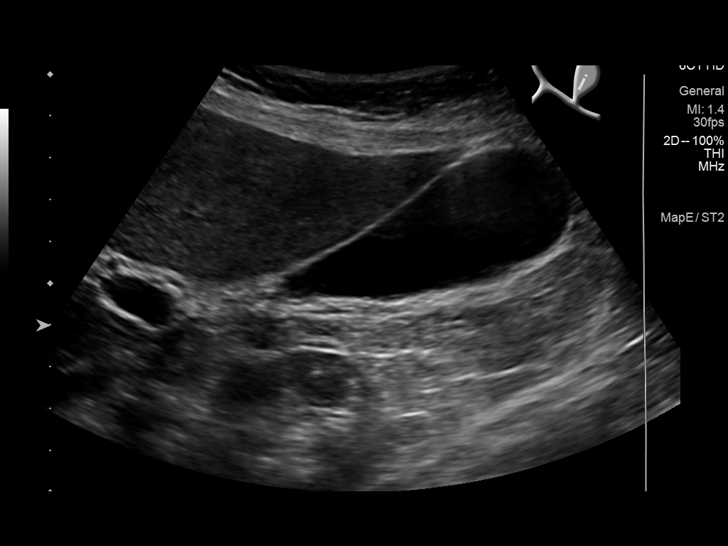
[im 7/77]
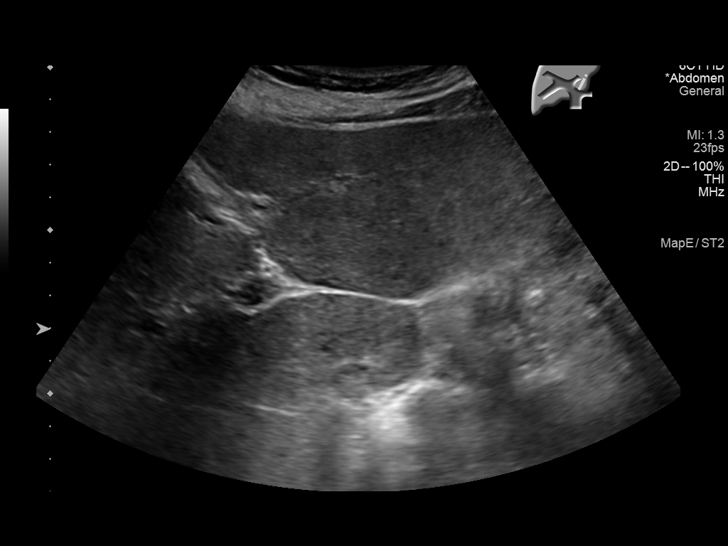
[im 13/77]
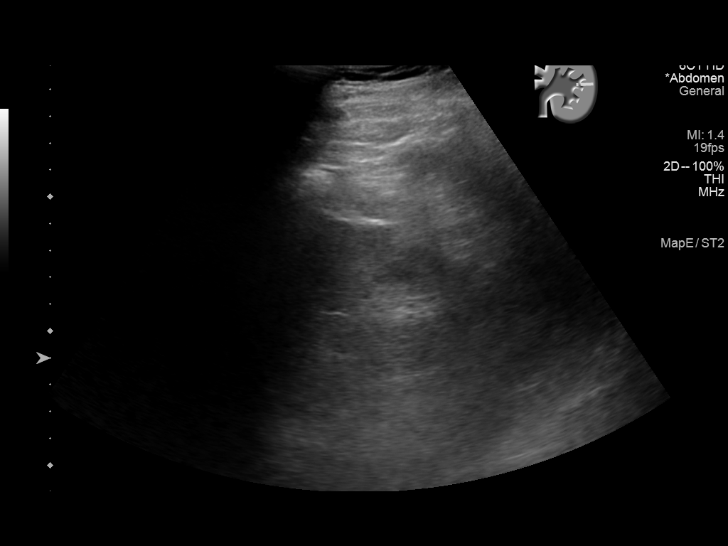
[im 20/77]
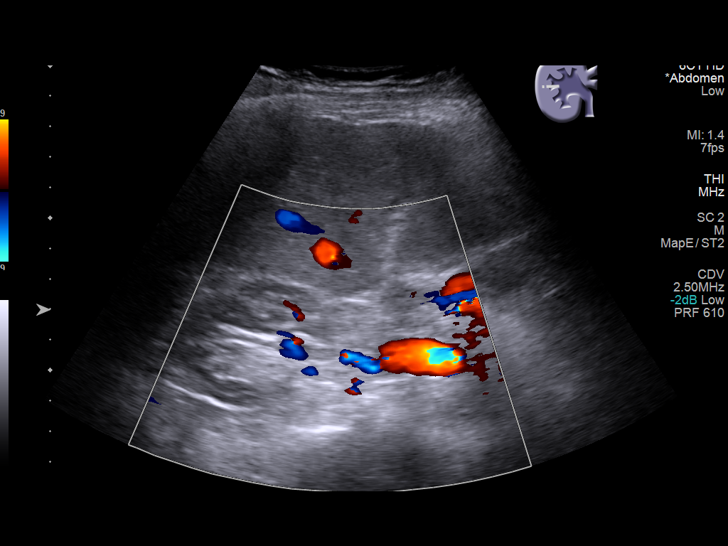
[im 26/77]
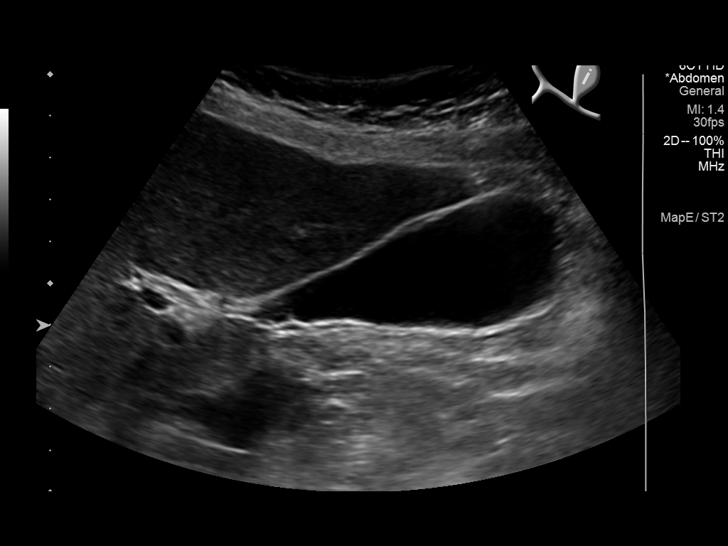
[im 32/77]
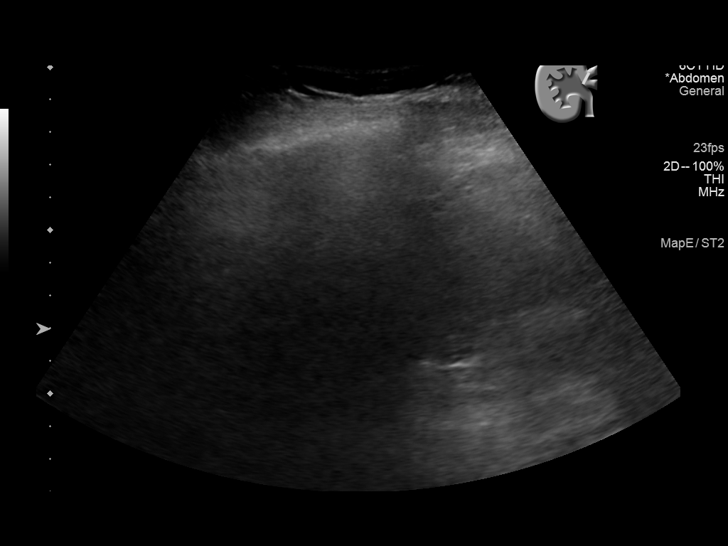
[im 39/77]
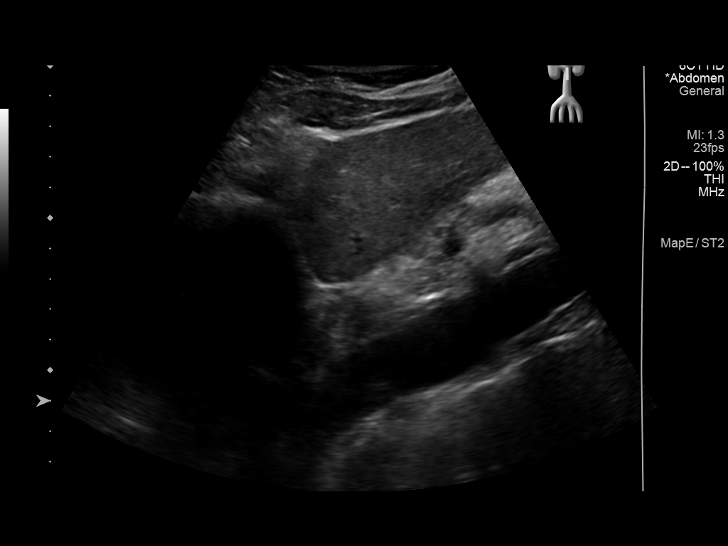
[im 45/77]
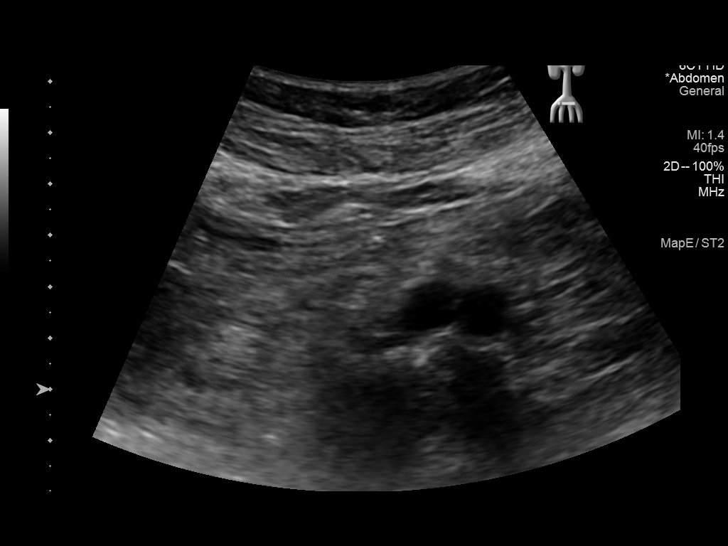
[im 51/77]
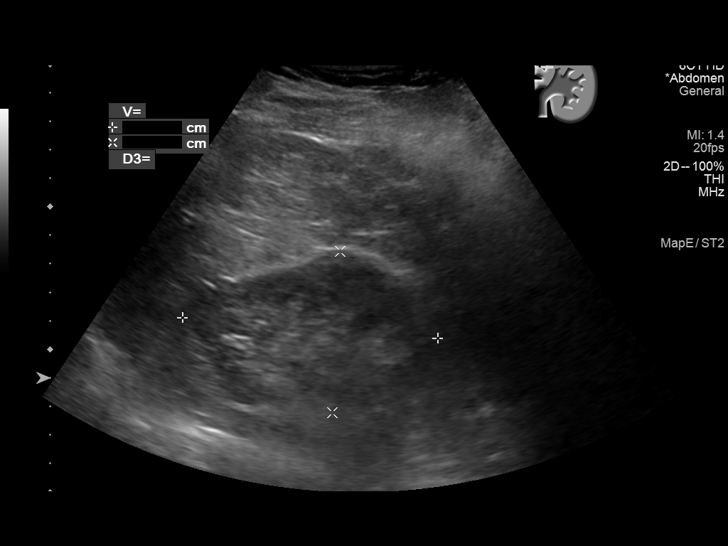
[im 58/77]
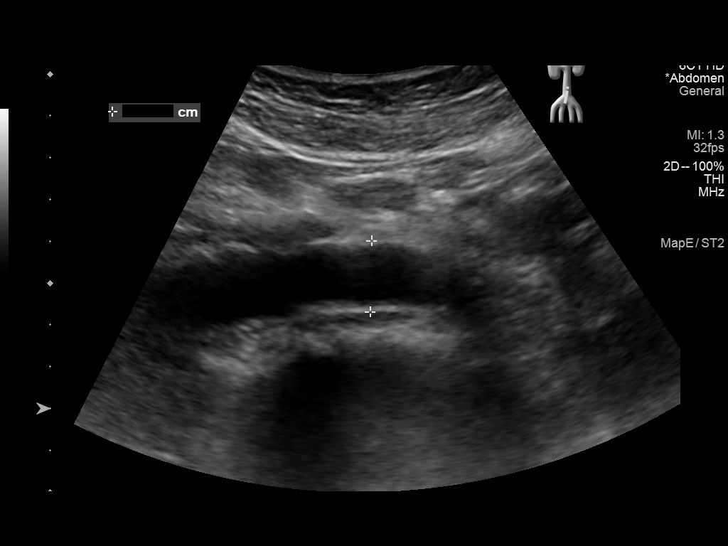
[im 64/77]
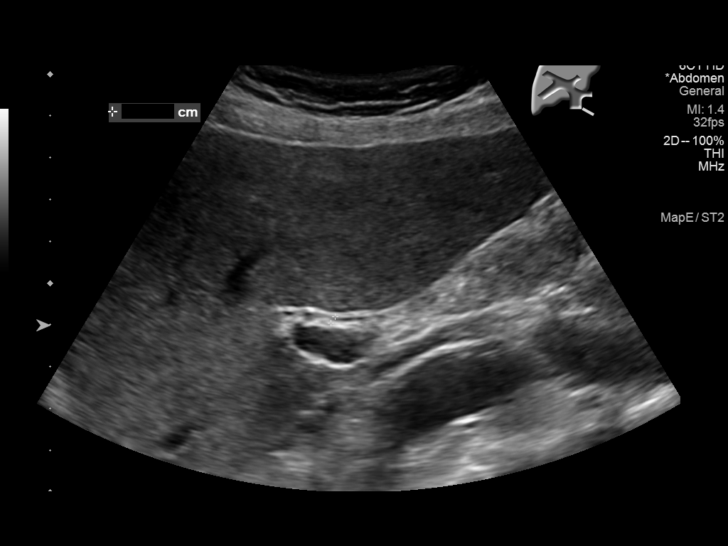
[im 70/77]
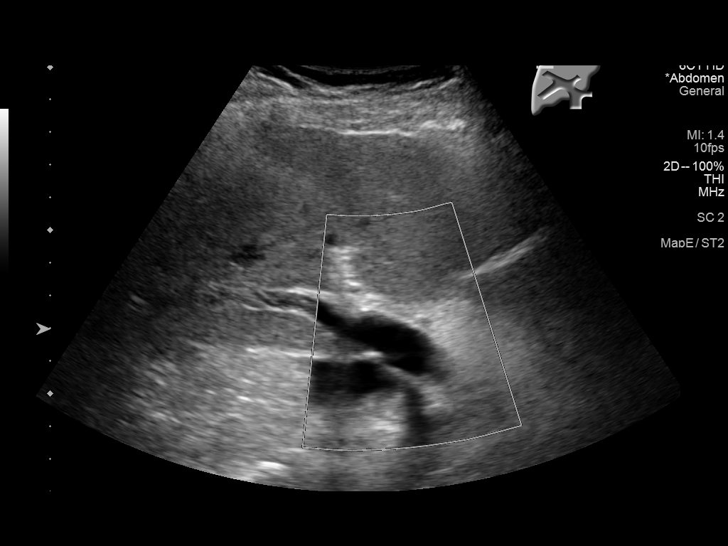
[im 77/77]
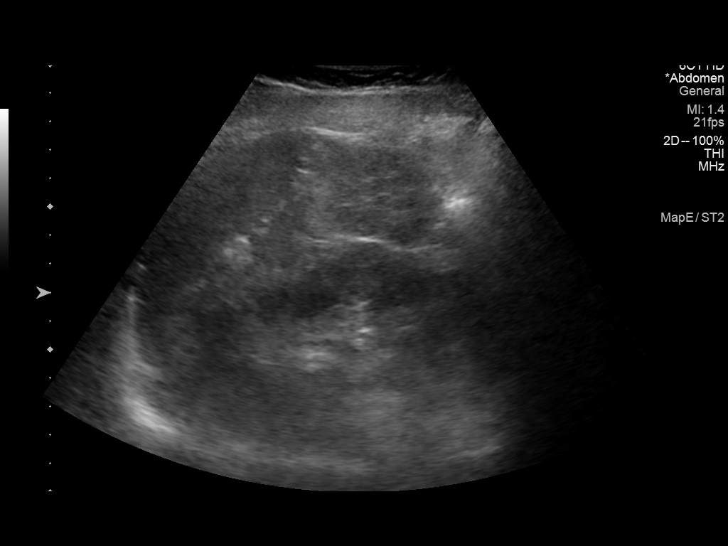

[13 of 25 positions shown; findings below may reference images not displayed]

FINDINGS: Gallbladder: No gallstones or wall thickening visualized. No
sonographic Murphy sign noted by sonographer.

Common bile duct: Diameter: 2 mm

Liver: Questionable minimal contour irregularity of the liver. No
focal hepatic abnormalities are identified. Portal vein is patent on
color Doppler imaging with normal direction of blood flow towards
the liver.

IVC: No abnormality identified. The infrahepatic portion of the IVC
is not visualized.

Pancreas: Visualized portion unremarkable.

Spleen: Size and appearance within normal limits.

Right Kidney: Length: 8.6 cm. There is diffuse renal cortical
thinning without focal abnormality or hydronephrosis.

Left Kidney: Length: 9.0 cm. There is diffuse renal cortical
thinning without focal abnormality or hydronephrosis.

Abdominal aorta: No aneurysm visualized.

Other findings: None.
IMPRESSION: 1. No evidence of biliary dilatation or gallbladder disease.
2. Questionable mild contour irregularity of the liver without focal
abnormality.
3. Bilateral renal cortical thinning consistent with chronic medical
renal disease.

## 2020-01-19 DIAGNOSIS — R1310 Dysphagia, unspecified: Secondary | ICD-10-CM | POA: Diagnosis not present

## 2020-01-19 DIAGNOSIS — Z1211 Encounter for screening for malignant neoplasm of colon: Secondary | ICD-10-CM | POA: Diagnosis not present

## 2020-01-21 ENCOUNTER — Other Ambulatory Visit: Payer: Self-pay | Admitting: Physician Assistant

## 2020-01-21 DIAGNOSIS — R1310 Dysphagia, unspecified: Secondary | ICD-10-CM

## 2020-01-30 ENCOUNTER — Ambulatory Visit
Admission: RE | Admit: 2020-01-30 | Discharge: 2020-01-30 | Disposition: A | Discharge status: Home / Self Care | Payer: BC Managed Care – PPO | Source: Ambulatory Visit | Attending: Physician Assistant | Admitting: Physician Assistant

## 2020-01-30 DIAGNOSIS — K219 Gastro-esophageal reflux disease without esophagitis: Secondary | ICD-10-CM | POA: Diagnosis not present

## 2020-01-30 DIAGNOSIS — R1310 Dysphagia, unspecified: Secondary | ICD-10-CM

## 2020-02-18 DIAGNOSIS — Z1159 Encounter for screening for other viral diseases: Secondary | ICD-10-CM | POA: Diagnosis not present

## 2020-02-23 DIAGNOSIS — R1310 Dysphagia, unspecified: Secondary | ICD-10-CM | POA: Diagnosis not present

## 2020-02-23 DIAGNOSIS — K222 Esophageal obstruction: Secondary | ICD-10-CM | POA: Diagnosis not present

## 2020-02-23 DIAGNOSIS — K5730 Diverticulosis of large intestine without perforation or abscess without bleeding: Secondary | ICD-10-CM | POA: Diagnosis not present

## 2020-02-23 DIAGNOSIS — K2930 Chronic superficial gastritis without bleeding: Secondary | ICD-10-CM | POA: Diagnosis not present

## 2020-02-23 DIAGNOSIS — Z1211 Encounter for screening for malignant neoplasm of colon: Secondary | ICD-10-CM | POA: Diagnosis not present

## 2020-02-23 DIAGNOSIS — R933 Abnormal findings on diagnostic imaging of other parts of digestive tract: Secondary | ICD-10-CM | POA: Diagnosis not present

## 2020-02-23 DIAGNOSIS — K648 Other hemorrhoids: Secondary | ICD-10-CM | POA: Diagnosis not present

## 2020-02-23 DIAGNOSIS — K228 Other specified diseases of esophagus: Secondary | ICD-10-CM | POA: Diagnosis not present

## 2020-02-23 DIAGNOSIS — K449 Diaphragmatic hernia without obstruction or gangrene: Secondary | ICD-10-CM | POA: Diagnosis not present

## 2020-02-23 DIAGNOSIS — K3189 Other diseases of stomach and duodenum: Secondary | ICD-10-CM | POA: Diagnosis not present

## 2020-02-23 DIAGNOSIS — K635 Polyp of colon: Secondary | ICD-10-CM | POA: Diagnosis not present

## 2020-04-05 DIAGNOSIS — Z01419 Encounter for gynecological examination (general) (routine) without abnormal findings: Secondary | ICD-10-CM | POA: Diagnosis not present

## 2020-04-27 DIAGNOSIS — G4700 Insomnia, unspecified: Secondary | ICD-10-CM | POA: Diagnosis not present

## 2020-04-27 DIAGNOSIS — I10 Essential (primary) hypertension: Secondary | ICD-10-CM | POA: Diagnosis not present

## 2020-04-27 DIAGNOSIS — E7800 Pure hypercholesterolemia, unspecified: Secondary | ICD-10-CM | POA: Diagnosis not present

## 2020-04-27 DIAGNOSIS — M818 Other osteoporosis without current pathological fracture: Secondary | ICD-10-CM | POA: Diagnosis not present

## 2020-05-10 ENCOUNTER — Other Ambulatory Visit: Payer: Self-pay | Admitting: Obstetrics and Gynecology

## 2020-05-10 DIAGNOSIS — Z1231 Encounter for screening mammogram for malignant neoplasm of breast: Secondary | ICD-10-CM

## 2020-06-02 DIAGNOSIS — L578 Other skin changes due to chronic exposure to nonionizing radiation: Secondary | ICD-10-CM | POA: Diagnosis not present

## 2020-06-02 DIAGNOSIS — D0472 Carcinoma in situ of skin of left lower limb, including hip: Secondary | ICD-10-CM | POA: Diagnosis not present

## 2020-06-02 DIAGNOSIS — D485 Neoplasm of uncertain behavior of skin: Secondary | ICD-10-CM | POA: Diagnosis not present

## 2020-06-02 DIAGNOSIS — L570 Actinic keratosis: Secondary | ICD-10-CM | POA: Diagnosis not present

## 2020-06-02 DIAGNOSIS — L821 Other seborrheic keratosis: Secondary | ICD-10-CM | POA: Diagnosis not present

## 2020-06-02 DIAGNOSIS — B079 Viral wart, unspecified: Secondary | ICD-10-CM | POA: Diagnosis not present

## 2020-06-09 ENCOUNTER — Ambulatory Visit
Admission: RE | Admit: 2020-06-09 | Discharge: 2020-06-09 | Disposition: A | Discharge status: Home / Self Care | Payer: BC Managed Care – PPO | Source: Ambulatory Visit | Attending: Obstetrics and Gynecology | Admitting: Obstetrics and Gynecology

## 2020-06-09 ENCOUNTER — Other Ambulatory Visit: Payer: Self-pay

## 2020-06-09 DIAGNOSIS — Z1231 Encounter for screening mammogram for malignant neoplasm of breast: Secondary | ICD-10-CM | POA: Diagnosis not present

## 2020-06-16 DIAGNOSIS — Z79899 Other long term (current) drug therapy: Secondary | ICD-10-CM | POA: Diagnosis not present

## 2020-06-16 DIAGNOSIS — L4059 Other psoriatic arthropathy: Secondary | ICD-10-CM | POA: Diagnosis not present

## 2020-06-16 DIAGNOSIS — M2550 Pain in unspecified joint: Secondary | ICD-10-CM | POA: Diagnosis not present

## 2020-06-16 DIAGNOSIS — M542 Cervicalgia: Secondary | ICD-10-CM | POA: Diagnosis not present

## 2020-06-22 DIAGNOSIS — M818 Other osteoporosis without current pathological fracture: Secondary | ICD-10-CM | POA: Diagnosis not present

## 2020-07-02 DIAGNOSIS — R1310 Dysphagia, unspecified: Secondary | ICD-10-CM | POA: Diagnosis not present

## 2020-07-02 DIAGNOSIS — K219 Gastro-esophageal reflux disease without esophagitis: Secondary | ICD-10-CM | POA: Diagnosis not present

## 2020-07-02 DIAGNOSIS — Z09 Encounter for follow-up examination after completed treatment for conditions other than malignant neoplasm: Secondary | ICD-10-CM | POA: Diagnosis not present

## 2020-07-06 DIAGNOSIS — D0472 Carcinoma in situ of skin of left lower limb, including hip: Secondary | ICD-10-CM | POA: Diagnosis not present

## 2020-08-16 DIAGNOSIS — Z23 Encounter for immunization: Secondary | ICD-10-CM | POA: Diagnosis not present

## 2020-10-07 IMAGING — MG DIGITAL SCREENING BILAT W/ TOMO W/ CAD
6 of 10 series · 6 of 30 positions shown · non-contrast
Comparison: Previous exam(s).

CLINICAL DATA: Screening.

EXAM:
DIGITAL SCREENING BILATERAL MAMMOGRAM WITH TOMO AND CAD

[L MLO synth-2D (1 of 2)]
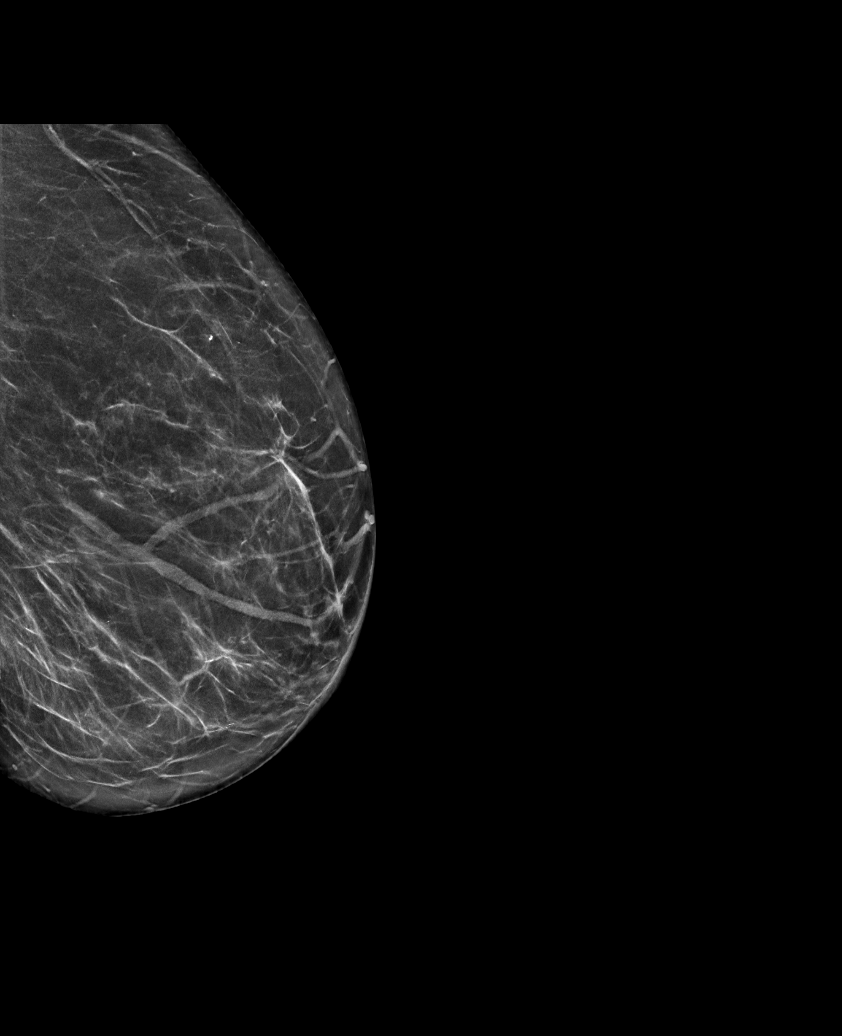

[R MLO synth-2D]
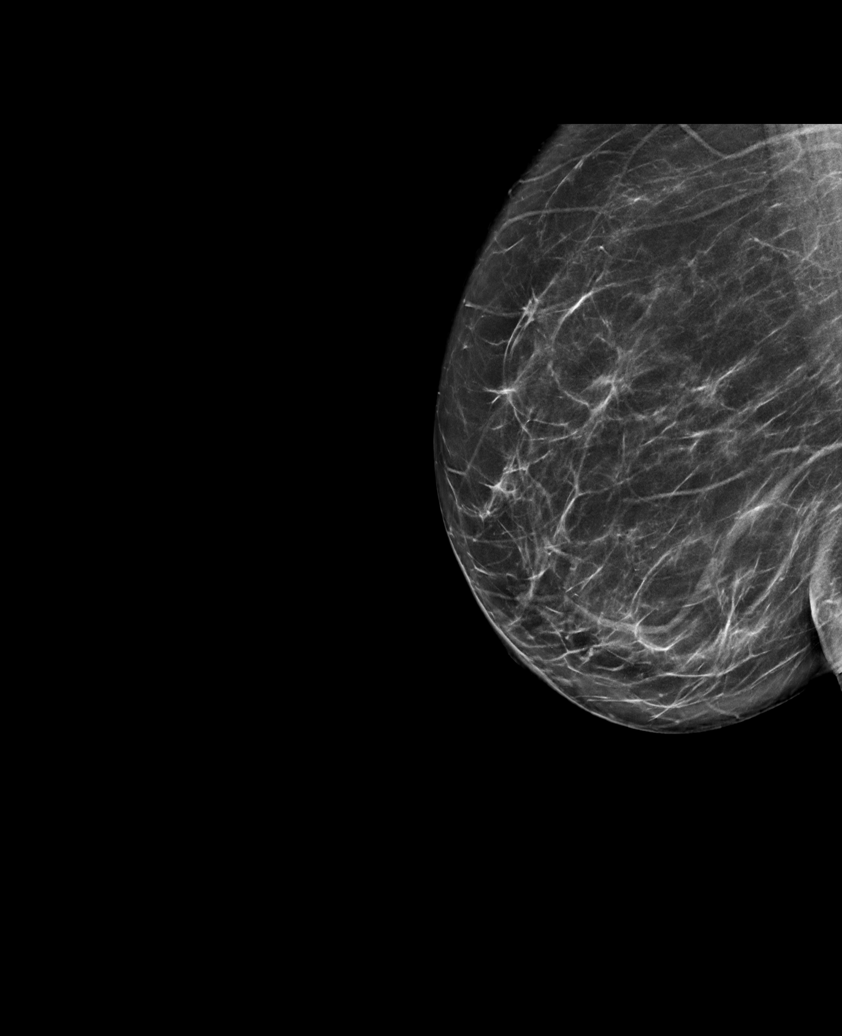

[L CC synth-2D]
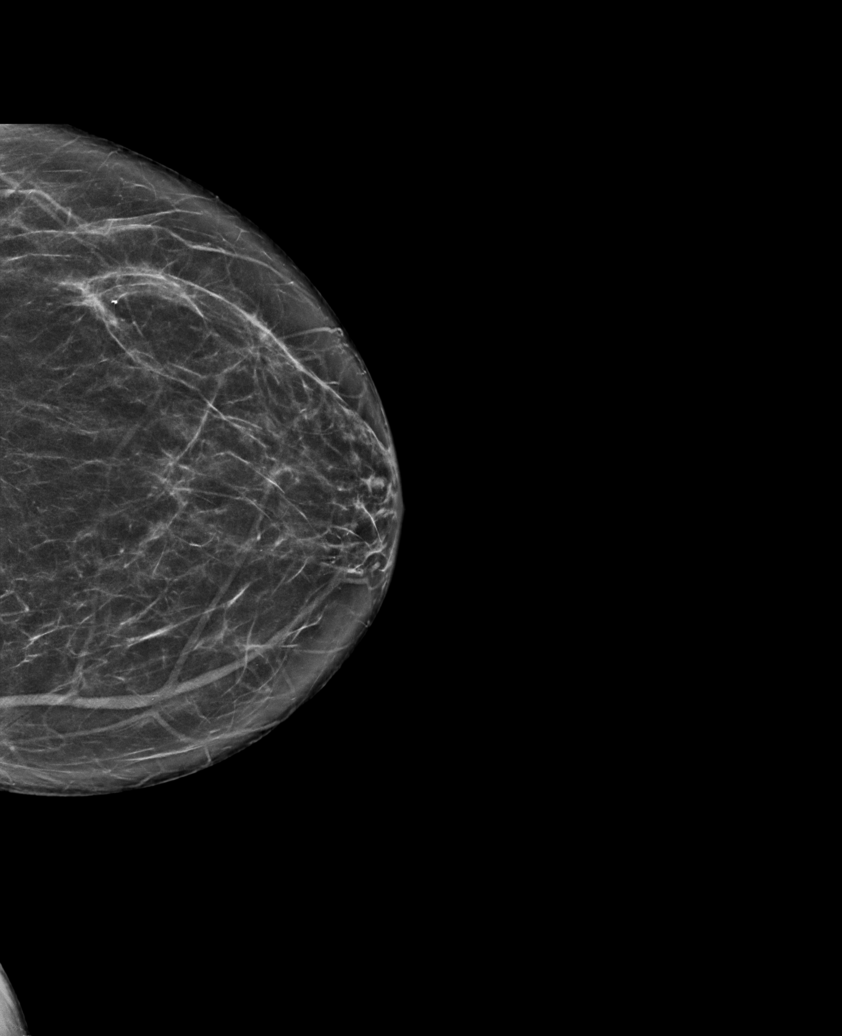

[L MLO synth-2D (2 of 2)]
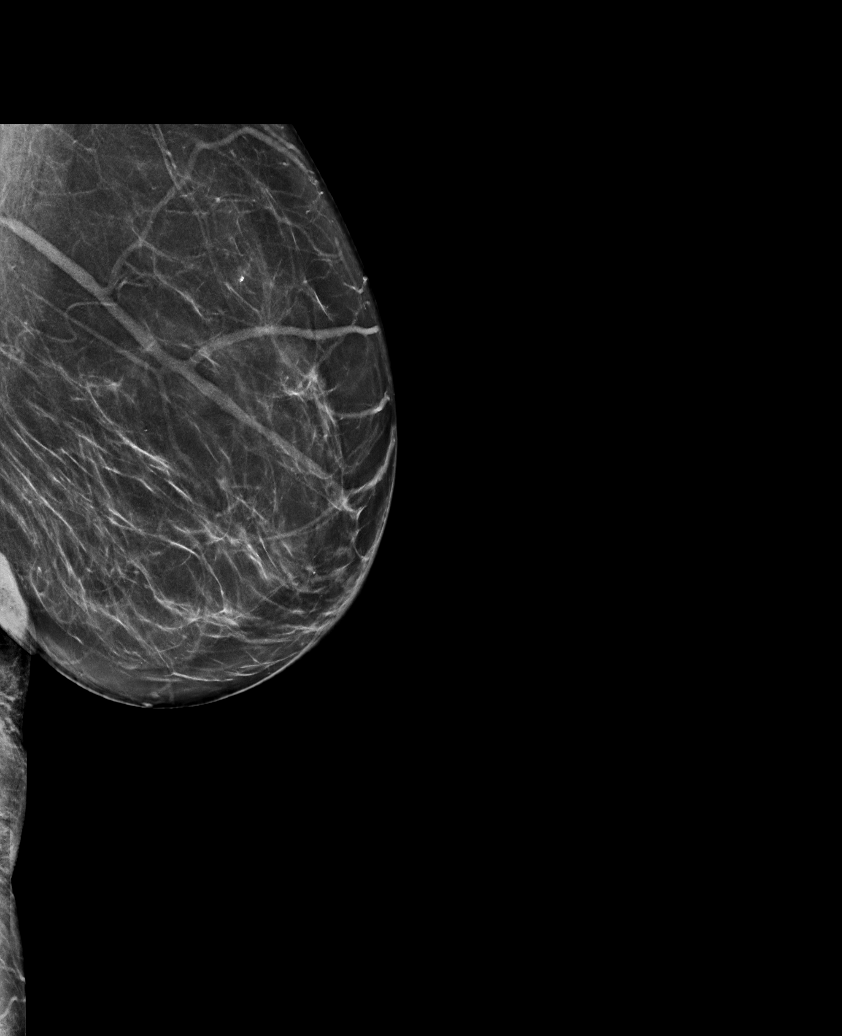

[R CC synth-2D]
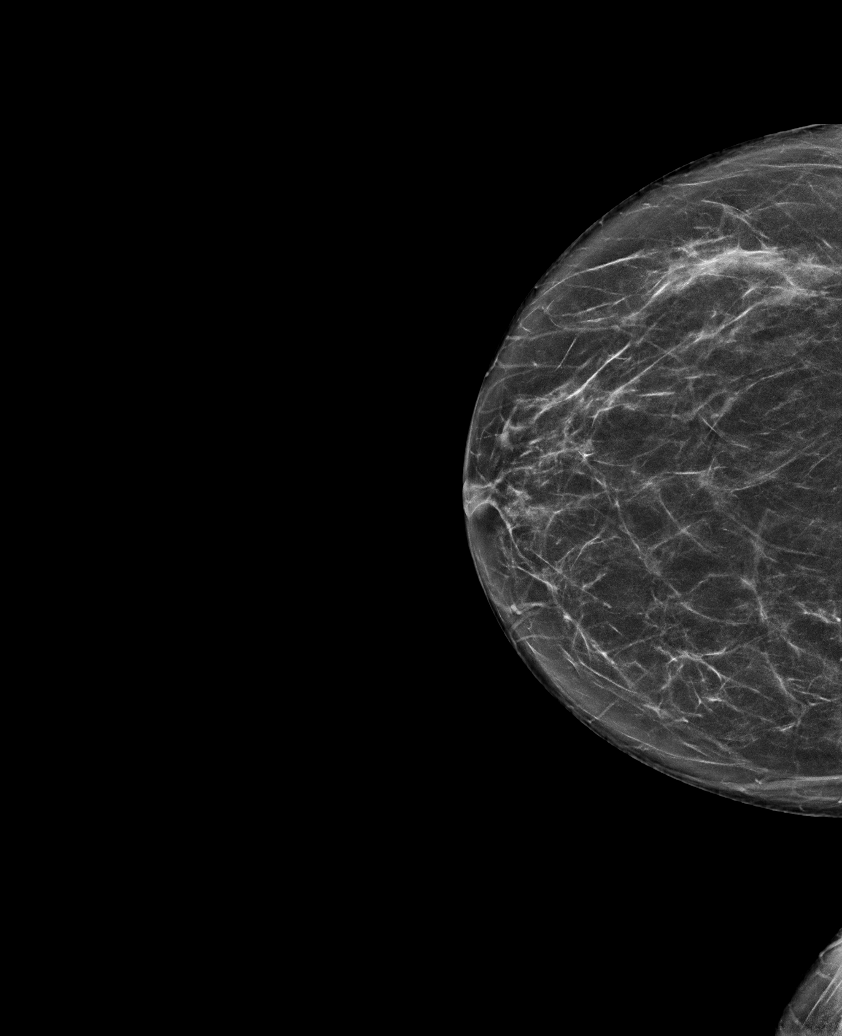

[R MLO tomo · tomo slice 39/77.0]
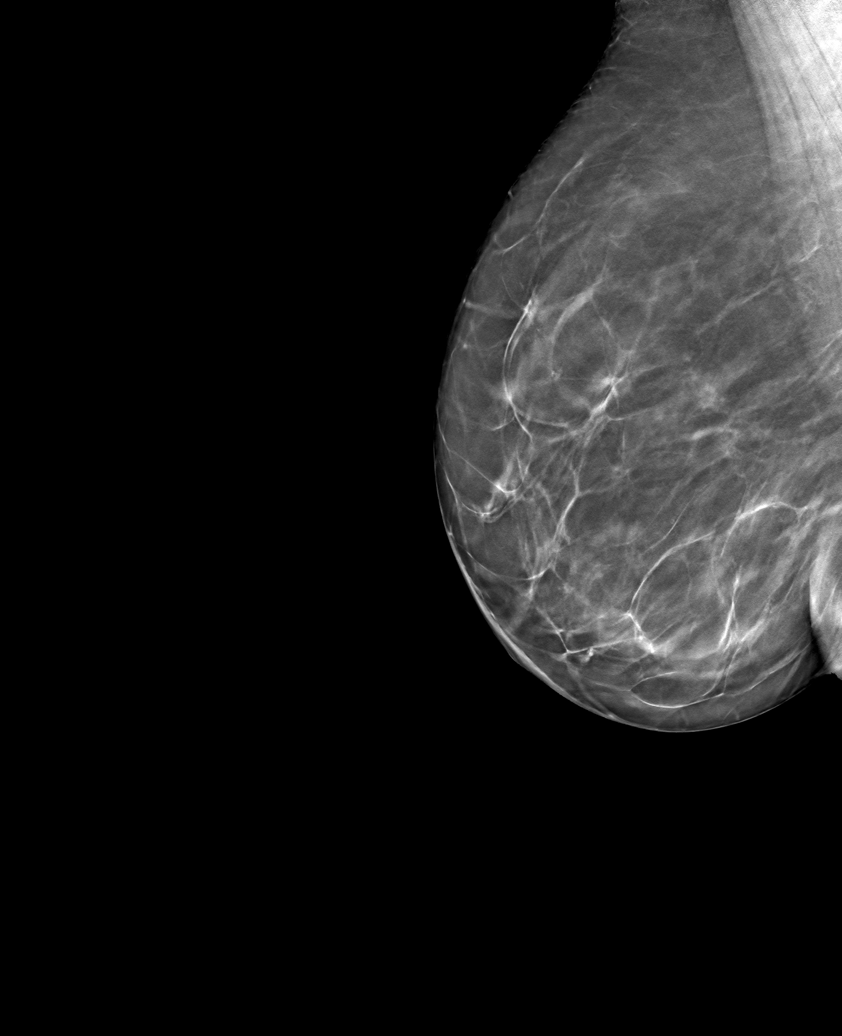

[6 of 30 positions shown; findings below may reference images not displayed]

ACR Breast Density Category b: There are scattered areas of
fibroglandular density.
FINDINGS: There are no findings suspicious for malignancy. Images were
processed with CAD.
IMPRESSION: No mammographic evidence of malignancy. A result letter of this
screening mammogram will be mailed directly to the patient.

RECOMMENDATION:
Screening mammogram in one year. (Code:[id number])

BI-RADS CATEGORY  1: Negative.

## 2021-04-29 ENCOUNTER — Other Ambulatory Visit: Payer: Self-pay | Admitting: Obstetrics and Gynecology

## 2021-04-29 DIAGNOSIS — Z1231 Encounter for screening mammogram for malignant neoplasm of breast: Secondary | ICD-10-CM

## 2021-06-23 ENCOUNTER — Ambulatory Visit: Payer: BC Managed Care – PPO

## 2021-07-22 ENCOUNTER — Ambulatory Visit: Payer: BC Managed Care – PPO

## 2021-08-08 ENCOUNTER — Ambulatory Visit
Admission: RE | Admit: 2021-08-08 | Discharge: 2021-08-08 | Disposition: A | Discharge status: Home / Self Care | Payer: BC Managed Care – PPO | Source: Ambulatory Visit | Attending: Obstetrics and Gynecology | Admitting: Obstetrics and Gynecology

## 2021-08-08 ENCOUNTER — Other Ambulatory Visit: Payer: Self-pay

## 2021-08-08 DIAGNOSIS — Z1231 Encounter for screening mammogram for malignant neoplasm of breast: Secondary | ICD-10-CM

## 2021-11-24 ENCOUNTER — Other Ambulatory Visit: Payer: Self-pay | Admitting: Family Medicine

## 2021-11-24 DIAGNOSIS — E2839 Other primary ovarian failure: Secondary | ICD-10-CM

## 2021-11-24 DIAGNOSIS — L4050 Arthropathic psoriasis, unspecified: Secondary | ICD-10-CM

## 2021-12-27 NOTE — Progress Notes (Addendum)
?Cardiology Office Note:   ? ?Date:  12/28/2021  ? ?ID:  [name], DOB September 24, 1956, MRN [id number] ? ?PCP:  Merri Brunette, MD  ?Cardiologist:  None   ? ?Referring MD: Merri Brunette, MD  ? ? ? ?History of Present Illness:   ? ?[name] is a 66 y.o. female with a hx of hypertension, pure hypercholesterolemia, and anxiety here today for the evaluation and management of EKG abnormality at the request of Dr. Katrinka Blazing. ? ?She saw Dr. Katrinka Blazing 11/16/21 for an annual medicare wellness exam. An abnormality was found on her EKG and they were concerned for atrial flutter. She was referred to cardiology. ? ?Today, she is doing well. She endorses psoriatic and rheumatoid arthritis that was officially diagnosed at 66 years old. She has been taking medications for these conditions since then. However, she reports she had these conditions since she was a teenager.  ? ?She wonders if the high heart rate is due to stress coming to the doctor's office. ? ?Currently, she does not record her blood pressure at home regularly. Yesterday, at her infusion, she reports her blood pressure was initially 140/88 but decreased to 110/70.  ? ?Her sister died at 42 years old. She also had rheumatoid arthritis and a hole in her heart. Her father had his first MI in his 60s and died in his 59s of either an MI or stroke. ? ?She used to go to the [facility] regularly but has not been able to since the pandemic. ? ?She denies any palpitations, chest pain, shortness of breath, or peripheral edema. No lightheadedness, headaches, syncope, orthopnea, or PND. ? ?Past Medical History:  ?Diagnosis Date  ? Anxiety   ? Arthritis   ? Arthritis with psoriasis (HCC)   ? Bruises easily   ? Complication of anesthesia   ? pt states awaken during operating room during right knee surgery   ? Concussion   ? 2015  ? Difficulty sleeping   ? Fall   ? 2015  ? Family history of ischemic heart disease   ? HA (headache)   ? migraine history secondary to fall in 2015 no  current issues  ? History of bronchitis   ? 1990's   ? History of nonmelanoma skin cancer   ? History of urinary tract infection   ? Hypercholesterolemia   ? Hypertension   ? Insomnia   ? Low back pain   ? Osteoporosis   ? PONV (postoperative nausea and vomiting)   ? Postmenopausal bleeding   ? Psoriasis   ? Right atrial enlargement   ? Squamous cell carcinoma   ? right ankle   ? Vaginal atrophy   ? Wears glasses   ? ? ?Past Surgical History:  ?Procedure Laterality Date  ? BACK SURGERY  2006  ? fusion C2-C6   ? CESAREAN SECTION    ? HAND SURGERY    ? multiple surgeries due to arthritis  ? TOTAL KNEE ARTHROPLASTY Right 11/03/2013  ? Procedure: RIGHT TOTAL KNEE ARTHROPLASTY;  Surgeon: [name], MD;  Location: [facility];  Service: Orthopedics;  Laterality: Right;  ? TOTAL KNEE ARTHROPLASTY Left 11/01/2015  ? Procedure: LEFT TOTAL KNEE ARTHROPLASTY;  Surgeon: Durene Romans, MD;  Location: [facility];  Service: Orthopedics;  Laterality: Left;  ? ? ?Current Medications: ?Current Meds  ?Medication Sig  ? CALCIUM CARBONATE-VITAMIN D PO Take 1 tablet by mouth daily.  ? denosumab (PROLIA) 60 MG/ML SOSY injection Inject 60 mg into the skin every 6 (  six) months.  ? golimumab (SIMPONI ARIA) 50 MG/4ML SOLN injection Inject 50 mg into the vein See admin instructions. Every 2 months  ? lisinopril (PRINIVIL,ZESTRIL) 10 MG tablet Take 10 mg by mouth daily with breakfast.  ? Multiple Vitamin (MULTIVITAMIN WITH MINERALS) TABS tablet Take 1 tablet by mouth daily.  ? tiZANidine (ZANAFLEX) 4 MG tablet Take 4 mg by mouth every 8 (eight) hours as needed.  ? traMADol (ULTRAM) 50 MG tablet Take 50 mg by mouth as needed.  ? zolpidem (AMBIEN) 10 MG tablet Take 10 mg by mouth at bedtime as needed for sleep.  ? [DISCONTINUED] ferrous sulfate 325 (65 FE) MG tablet Take 1 tablet (325 mg total) by mouth 3 (three) times daily after meals.  ?  ? ?Allergies:   Arava [leflunomide], Clinoril [sulindac], Codeine, Other, and Plaquenil [hydroxychloroquine]   ? ?Social History  ? ?Socioeconomic History  ? Marital status: Married  ?  Spouse name: Theron Arista   ? Number of children: 2  ? Years of education: college  ? Highest education level: Not on file  ?Occupational History  ?  Employer: NOT EMPLOYED  ?Tobacco Use  ? Smoking status: Never  ? Smokeless tobacco: Never  ?Substance and Sexual Activity  ? Alcohol use: Yes  ?  Comment: occasional  ? Drug use: No  ? Sexual activity: Not on file  ?Other Topics Concern  ? Not on file  ?Social History Narrative  ? Patient lives at home with her husband Theron Arista). Patient is a Agricultural consultant . Retired.  ? Education college.  ? Right handed.  ? Caffeine 1-2 cups of caffeine daily.  ? ?Social Determinants of Health  ? ?Financial Resource Strain: Not on file  ?Food Insecurity: Not on file  ?Transportation Needs: Not on file  ?Physical Activity: Not on file  ?Stress: Not on file  ?Social Connections: Not on file  ?  ? ?Family History: ?The patient's family history includes Heart disease in her sister; Lupus in her sister; Migraines in her mother. There is no history of Breast cancer. ? ?ROS:   ?Please see the history of present illness.    ?All other systems reviewed and negative.  ? ?EKGs/Labs/Other Studies Reviewed:   ? ?The following studies were reviewed today: ?No prior cardiovascular studies ? ?EKG:  EKG was personally reviewed ?12/28/21: Sinus tachycardia, rate 105 bpm ? ?Recent Labs: ?No results found for requested labs within last 8760 hours.  ? ?Recent Lipid Panel ?No results found for: CHOL, TRIG, HDL, CHOLHDL, VLDL, LDLCALC, LDLDIRECT ? ?CHA2DS2-VASc Score =   .  Therefore, the patient's annual risk of stroke is   %.    ?  ? ? ?Physical Exam:   ? ?VS:  BP 126/86   Pulse (!) 105   Ht  (1.549 m)   Wt 123 lb 6.4 oz (56 kg)   SpO2 96%   BMI 23.32 kg/m?    ? ?Wt Readings from Last 3 Encounters:  ?12/28/21 123 lb 6.4 oz (56 kg)  ?05/08/16 125 lb (56.7 kg)  ?11/01/15 126 lb (57.2 kg)  ?  ? ?GEN: Well nourished, well developed in  no acute distress ?HEENT: Normal ?NECK: No JVD; No carotid bruits ?LYMPHATICS: No lymphadenopathy ?CARDIAC: Tachycardic regular, no murmurs, rubs, gallops ?RESPIRATORY:  Clear to auscultation without rales, wheezing or rhonchi  ?ABDOMEN: Soft, non-tender, non-distended ?MUSCULOSKELETAL:  No edema; rheumatoid changes in hands, ankles ?SKIN: Warm and dry ?NEUROLOGIC:  Alert and oriented x 3 ?PSYCHIATRIC:  Normal affect  ? ?  ASSESSMENT:   ? ?1. Nonspecific abnormal electrocardiogram (ECG) (EKG)   ?2. Family history of coronary artery disease   ?3. Family history of early CAD   ?4. Rheumatoid arthritis involving both hands, unspecified whether rheumatoid factor present (HCC)   ? ?PLAN:   ? ?Nonspecific abnormal electrocardiogram (ECG) (EKG) ?Today's EKG shows sinus tachycardia 105 with peaked T waves inferiorly.  We will go ahead and check an echocardiogram to ensure proper structure and function.  It appears that her sister also with rheumatoid arthritis had a congenital heart abnormality.  I do not appreciate any significant murmurs on exam.  She also does not have any palpitations or awareness of her heart rate.  At prior office visit with Dr. Smith her hearKatrinka Blazingt rate was 101 bpm.  Today, I do not see any evidence of atrial flutter present.  Prior EKG from 2015 also personally reviewed and interpreted shows sinus rhythm in the 80s.  With rheumatoid arthritis/psoriatic arthritis, autoimmune conditions, heart abnormalities can occur and it does make sense to ensure proper structure and function. ? ?Family history of early CAD ?We will go ahead and check a coronary calcium score, $99.  If atherosclerosis is present, we will advocate Crestor for plaque stabilization.  Described physiology to her.  Autoimmune conditions can lead to early atherosclerosis. ? ?Rheumatoid arthritis (HCC) ?Medications reviewed.  She receives Simponi Aria 1 infusion every 2 months.  At this time, I think she can continue with this medication. ? ? ?   ? ?Addendum-outside EKG reviewed, heart rate 98 bpm, appears to be sinus rhythm.  EKG was interpreted as atrial flutter by device. ?Donato SchultzMark Coley Kulikowski, MD ? ? ?Medication Adjustments/Labs and Tests Ordered: ?Current med

## 2021-12-28 ENCOUNTER — Encounter: Payer: Self-pay | Admitting: Cardiology

## 2021-12-28 ENCOUNTER — Other Ambulatory Visit: Payer: Self-pay

## 2021-12-28 ENCOUNTER — Ambulatory Visit (INDEPENDENT_AMBULATORY_CARE_PROVIDER_SITE_OTHER): Payer: Medicare Other | Admitting: Cardiology

## 2021-12-28 VITALS — BP 126/86 | HR 105 | Ht 61.0 in | Wt 123.4 lb

## 2021-12-28 DIAGNOSIS — Z8249 Family history of ischemic heart disease and other diseases of the circulatory system: Secondary | ICD-10-CM

## 2021-12-28 DIAGNOSIS — R9431 Abnormal electrocardiogram [ECG] [EKG]: Secondary | ICD-10-CM | POA: Diagnosis not present

## 2021-12-28 DIAGNOSIS — M069 Rheumatoid arthritis, unspecified: Secondary | ICD-10-CM | POA: Diagnosis not present

## 2021-12-28 NOTE — Assessment & Plan Note (Signed)
Medications reviewed.  She receives Simponi Aria 1 infusion every 2 months.  At this time, I think she can continue with this medication. ?

## 2021-12-28 NOTE — Patient Instructions (Signed)
Medication Instructions:  ?The current medical regimen is effective;  continue present plan and medications. ? ?*If you need a refill on your cardiac medications before your next appointment, please call your pharmacy* ? ?Testing/Procedures: ?Your physician has requested that you have an echocardiogram. Echocardiography is a painless test that uses sound waves to create images of your heart. It provides your doctor with information about the size and shape of your heart and how well your heart?s chambers and valves are working. This procedure takes approximately one hour. There are no restrictions for this procedure. ? ?Your physician has requested that you have a Coronary Calcium score which is completed by CT. Cardiac computed tomography (CT) is a painless test that uses an x-ray machine to take clear, detailed pictures of your heart. There are no instructions for this testing.  You may eat/drink and take your normal medications this day.  The cost of the testing is $99 due at the time of your appointment. ? ?Follow-Up: ?At [facility], you and your health needs are our priority.  As part of our continuing mission to provide you with exceptional heart care, we have created designated Provider Care Teams.  These Care Teams include your primary Cardiologist (physician) and Advanced Practice Providers (APPs -  Physician Assistants and Nurse Practitioners) who all work together to provide you with the care you need, when you need it. ? ?We recommend signing up for the patient portal called "MyChart".  Sign up information is provided on this After Visit Summary.  MyChart is used to connect with patients for Virtual Visits (Telemedicine).  Patients are able to view lab/test results, encounter notes, upcoming appointments, etc.  Non-urgent messages can be sent to your provider as well.   ?To learn more about what you can do with MyChart, go to [email or web address].   ? ?Your next appointment:   ?Follow up will be  based on the results of the above testing. ? ?Thank you for choosing [facility]!! ? ? ? ?

## 2021-12-28 NOTE — Assessment & Plan Note (Signed)
Today's EKG shows sinus tachycardia 105 with peaked T waves inferiorly.  We will go ahead and check an echocardiogram to ensure proper structure and function.  It appears that her sister also with rheumatoid arthritis had a congenital heart abnormality.  I do not appreciate any significant murmurs on exam.  She also does not have any palpitations or awareness of her heart rate.  At prior office visit with Dr. Katrinka Blazing her heart rate was 101 bpm.  Today, I do not see any evidence of atrial flutter present.  Prior EKG from 2015 also personally reviewed and interpreted shows sinus rhythm in the 80s.  With rheumatoid arthritis/psoriatic arthritis, autoimmune conditions, heart abnormalities can occur and it does make sense to ensure proper structure and function. ?

## 2021-12-28 NOTE — Assessment & Plan Note (Signed)
We will go ahead and check a coronary calcium score, $99.  If atherosclerosis is present, we will advocate Crestor for plaque stabilization.  Described physiology to her.  Autoimmune conditions can lead to early atherosclerosis. ?

## 2022-01-12 ENCOUNTER — Ambulatory Visit (HOSPITAL_COMMUNITY): Payer: Medicare Other | Attending: Cardiology

## 2022-01-12 ENCOUNTER — Ambulatory Visit (INDEPENDENT_AMBULATORY_CARE_PROVIDER_SITE_OTHER)
Admission: RE | Admit: 2022-01-12 | Discharge: 2022-01-12 | Disposition: A | Discharge status: Home / Self Care | Payer: Self-pay | Source: Ambulatory Visit | Attending: Cardiology | Admitting: Cardiology

## 2022-01-12 DIAGNOSIS — R9431 Abnormal electrocardiogram [ECG] [EKG]: Secondary | ICD-10-CM | POA: Insufficient documentation

## 2022-01-12 DIAGNOSIS — Z8249 Family history of ischemic heart disease and other diseases of the circulatory system: Secondary | ICD-10-CM

## 2022-01-12 LAB — ECHOCARDIOGRAM COMPLETE
Area-P 1/2: 4.74 cm2
P 1/2 time: 508 msec
S' Lateral: 2.2 cm

## 2022-01-20 ENCOUNTER — Telehealth: Payer: Self-pay | Admitting: *Deleted

## 2022-01-20 DIAGNOSIS — R931 Abnormal findings on diagnostic imaging of heart and coronary circulation: Secondary | ICD-10-CM

## 2022-01-20 DIAGNOSIS — I7789 Other specified disorders of arteries and arterioles: Secondary | ICD-10-CM

## 2022-01-20 DIAGNOSIS — Z79899 Other long term (current) drug therapy: Secondary | ICD-10-CM

## 2022-01-20 MED ORDER — ROSUVASTATIN CALCIUM 10 MG PO TABS: 10.0000 mg | ORAL_TABLET | Freq: Every day | ORAL | 3 refills | Status: DC

## 2022-01-20 NOTE — Telephone Encounter (Signed)
Coronary calcium score is 12, 61 percentile.  Given this finding, I would like to start Crestor 10 mg once a day.  Repeat lipid panel in 3 months with ALT.  ? ?Also, ascending aorta is moderately dilated at 45 to 46 mm.  In 1 year, lets check a CTA of the chest with contrast to monitor for stability.  In the meantime, continue with good blood pressure control.  ? ?Donato Schultz, MD  ? ?Orders placed per Dr Anne Fu ?

## 2022-04-19 ENCOUNTER — Other Ambulatory Visit (HOSPITAL_COMMUNITY)
Admission: RE | Admit: 2022-04-19 | Discharge: 2022-04-19 | Disposition: A | Discharge status: Home / Self Care | Payer: Medicare Other | Source: Ambulatory Visit | Attending: Obstetrics and Gynecology | Admitting: Obstetrics and Gynecology

## 2022-04-19 ENCOUNTER — Other Ambulatory Visit: Payer: Self-pay | Admitting: Obstetrics and Gynecology

## 2022-04-19 DIAGNOSIS — Z01419 Encounter for gynecological examination (general) (routine) without abnormal findings: Secondary | ICD-10-CM | POA: Insufficient documentation

## 2022-04-19 DIAGNOSIS — Z1151 Encounter for screening for human papillomavirus (HPV): Secondary | ICD-10-CM | POA: Diagnosis not present

## 2022-04-20 LAB — CYTOLOGY - PAP
Comment: NEGATIVE
Diagnosis: NEGATIVE
High risk HPV: NEGATIVE

## 2022-04-24 ENCOUNTER — Ambulatory Visit
Admission: RE | Admit: 2022-04-24 | Discharge: 2022-04-24 | Disposition: A | Discharge status: Home / Self Care | Payer: Self-pay | Source: Ambulatory Visit | Attending: Family Medicine | Admitting: Family Medicine

## 2022-04-24 DIAGNOSIS — E2839 Other primary ovarian failure: Secondary | ICD-10-CM

## 2022-04-24 DIAGNOSIS — L4050 Arthropathic psoriasis, unspecified: Secondary | ICD-10-CM

## 2022-05-01 ENCOUNTER — Other Ambulatory Visit: Payer: Medicare Other

## 2022-05-01 DIAGNOSIS — R931 Abnormal findings on diagnostic imaging of heart and coronary circulation: Secondary | ICD-10-CM

## 2022-05-01 DIAGNOSIS — Z79899 Other long term (current) drug therapy: Secondary | ICD-10-CM

## 2022-05-01 LAB — LIPID PANEL
Chol/HDL Ratio: 2.2 ratio (ref 0.0–4.4)
Cholesterol, Total: 152 mg/dL (ref 100–199)
HDL: 69 mg/dL (ref >39)
LDL Chol Calc (NIH): 57 mg/dL (ref 0–99)
Triglycerides: 155 mg/dL — ABNORMAL HIGH (ref 0–149)
VLDL Cholesterol Cal: 26 mg/dL (ref 5–40)

## 2022-05-01 LAB — ALT: ALT: 19 IU/L (ref 0–32)

## 2022-11-21 ENCOUNTER — Other Ambulatory Visit: Payer: Self-pay | Admitting: Cardiology

## 2022-12-19 ENCOUNTER — Telehealth: Payer: Self-pay | Admitting: *Deleted

## 2022-12-19 NOTE — Telephone Encounter (Signed)
A message was left [phone] her CTA Chest.

## 2022-12-21 ENCOUNTER — Other Ambulatory Visit: Payer: Self-pay | Admitting: Family Medicine

## 2022-12-21 DIAGNOSIS — Z1231 Encounter for screening mammogram for malignant neoplasm of breast: Secondary | ICD-10-CM

## 2023-01-09 ENCOUNTER — Ambulatory Visit (HOSPITAL_BASED_OUTPATIENT_CLINIC_OR_DEPARTMENT_OTHER)
Admission: RE | Admit: 2023-01-09 | Discharge: 2023-01-09 | Disposition: A | Discharge status: Home / Self Care | Payer: Medicare Other | Source: Ambulatory Visit | Attending: Cardiology | Admitting: Cardiology

## 2023-01-09 ENCOUNTER — Encounter (HOSPITAL_BASED_OUTPATIENT_CLINIC_OR_DEPARTMENT_OTHER): Payer: Self-pay

## 2023-01-09 DIAGNOSIS — I7789 Other specified disorders of arteries and arterioles: Secondary | ICD-10-CM | POA: Insufficient documentation

## 2023-01-09 MED ORDER — IOHEXOL 350 MG/ML SOLN
100.0000 mL | Freq: Once | INTRAVENOUS | Status: AC | PRN
  Administered 2023-01-09: 50 mL via INTRAVENOUS

## 2023-01-11 LAB — POCT I-STAT CREATININE: Creatinine, Ser: 0.8 mg/dL (ref 0.44–1.00)

## 2023-01-22 ENCOUNTER — Telehealth: Payer: Self-pay | Admitting: Cardiology

## 2023-01-22 NOTE — Telephone Encounter (Signed)
Called the pt back.  She asked that we update her pharmacy preference to [facility] in [place].  Updated this for the pt in her chart.   Pt is also asking when is she due to see Dr. Anne Fu for routine follow-up.  Informed her that I do not see a recall for this, so what I will do is route this message to Dr. Judd Gaudier RN to further follow-up and assist her with getting her next follow-up appt scheduled with him, when she returns to the office.    Pt verbalized understanding and agrees with this plan.

## 2023-01-22 NOTE — Telephone Encounter (Signed)
Patient returned call to verify her pharmacy, she said her pharmacy is the [facility] in [place].  She also wants to know when she should see Dr. Anne Fu again for follow up.

## 2023-01-23 NOTE — Telephone Encounter (Signed)
Will verify with Dr Anne Fu as to when pt needs to be seen in follow up.

## 2023-01-31 ENCOUNTER — Ambulatory Visit: Payer: Medicare Other

## 2023-02-01 NOTE — Telephone Encounter (Signed)
Pt has been scheduled for 4/23 with Jari Favre, PA.

## 2023-02-06 ENCOUNTER — Ambulatory Visit: Payer: Medicare Other | Attending: Physician Assistant | Admitting: Physician Assistant

## 2023-02-06 ENCOUNTER — Encounter: Payer: Self-pay | Admitting: Physician Assistant

## 2023-02-06 VITALS — BP 126/82 | HR 109 | Ht 61.0 in | Wt 118.8 lb

## 2023-02-06 DIAGNOSIS — I719 Aortic aneurysm of unspecified site, without rupture: Secondary | ICD-10-CM

## 2023-02-06 DIAGNOSIS — I7789 Other specified disorders of arteries and arterioles: Secondary | ICD-10-CM | POA: Diagnosis present

## 2023-02-06 DIAGNOSIS — Z8249 Family history of ischemic heart disease and other diseases of the circulatory system: Secondary | ICD-10-CM

## 2023-02-06 DIAGNOSIS — M069 Rheumatoid arthritis, unspecified: Secondary | ICD-10-CM

## 2023-02-06 DIAGNOSIS — Z79899 Other long term (current) drug therapy: Secondary | ICD-10-CM

## 2023-02-06 MED ORDER — METOPROLOL SUCCINATE ER 25 MG PO TB24: 25.0000 mg | ORAL_TABLET | Freq: Every day | ORAL | 3 refills | Status: DC

## 2023-02-06 NOTE — Patient Instructions (Addendum)
Medication Instructions:  1.Start metoprolol succinate (Toprol XL) 25 mg daily *If you need a refill on your cardiac medications before your next appointment, please call your pharmacy*   Lab Work: None If you have labs (blood work) drawn today and your tests are completely normal, you will receive your results only by: MyChart Message (if you have MyChart) OR A paper copy in the mail If you have any lab test that is abnormal or we need to change your treatment, we will call you to review the results.   Testing/Procedures: Your provider has recommended that you have a Chest CTA in 12/2023. You will receive a call to schedule this at a later date.    Follow-Up: At [facility], you and your health needs are our priority.  As part of our continuing mission to provide you with exceptional heart care, we have created designated Provider Care Teams.  These Care Teams include your primary Cardiologist (physician) and Advanced Practice Providers (APPs -  Physician Assistants and Nurse Practitioners) who all work together to provide you with the care you need, when you need it.   Your next appointment:   6 month(s)  Provider:   Donato Schultz, MD    Other Instructions Check your blood pressure daily, 1-2 hrs after taking your morning medications for 2 weeks, keep a log and send [place] the readings through mychart at the end of the 2 weeks  Low-Sodium Eating Plan Sodium, which is an element that makes up salt, helps you maintain a healthy balance of fluids in your body. Too much sodium can increase your blood pressure and cause fluid and waste to be held in your body. Your health care provider or dietitian may recommend following this plan if you have high blood pressure (hypertension), kidney disease, liver disease, or heart failure. Eating less sodium can help lower your blood pressure, reduce swelling, and protect your heart, liver, and kidneys. What are tips for following this  plan? Reading food labels The Nutrition Facts label lists the amount of sodium in one serving of the food. If you eat more than one serving, you must multiply the listed amount of sodium by the number of servings. Choose foods with less than 140 mg of sodium per serving. Avoid foods with 300 mg of sodium or more per serving. Shopping  Look for lower-sodium products, often labeled as "low-sodium" or "no salt added." Always check the sodium content, even if foods are labeled as "unsalted" or "no salt added." Buy fresh foods. Avoid canned foods and pre-made or frozen meals. Avoid canned, cured, or processed meats. Buy breads that have less than 80 mg of sodium per slice. Cooking  Eat more home-cooked food and less restaurant, buffet, and fast food. Avoid adding salt when cooking. Use salt-free seasonings or herbs instead of table salt or sea salt. Check with your health care provider or pharmacist before using salt substitutes. Cook with plant-based oils, such as canola, sunflower, or olive oil. Meal planning When eating at a restaurant, ask that your food be prepared with less salt or no salt, if possible. Avoid dishes labeled as brined, pickled, cured, smoked, or made with soy sauce, miso, or teriyaki sauce. Avoid foods that contain MSG (monosodium glutamate). MSG is sometimes added to [place] food, bouillon, and some canned foods. Make meals that can be grilled, baked, poached, roasted, or steamed. These are generally made with less sodium. General information Most people on this plan should limit their sodium intake to 1,500-2,000  mg (milligrams) of sodium each day. What foods should I eat? Fruits Fresh, frozen, or canned fruit. Fruit juice. Vegetables Fresh or frozen vegetables. "No salt added" canned vegetables. "No salt added" tomato sauce and paste. Low-sodium or reduced-sodium tomato and vegetable juice. Grains Low-sodium cereals, including oats, puffed wheat and rice, and  shredded wheat. Low-sodium crackers. Unsalted rice. Unsalted pasta. Low-sodium bread. Whole-grain breads and whole-grain pasta. Meats and other proteins Fresh or frozen (no salt added) meat, poultry, seafood, and fish. Low-sodium canned tuna and salmon. Unsalted nuts. Dried peas, beans, and lentils without added salt. Unsalted canned beans. Eggs. Unsalted nut butters. Dairy Milk. Soy milk. Cheese that is naturally low in sodium, such as ricotta cheese, fresh mozzarella, or Swiss cheese. Low-sodium or reduced-sodium cheese. Cream cheese. Yogurt. Seasonings and condiments Fresh and dried herbs and spices. Salt-free seasonings. Low-sodium mustard and ketchup. Sodium-free salad dressing. Sodium-free light mayonnaise. Fresh or refrigerated horseradish. Lemon juice. Vinegar. Other foods Homemade, reduced-sodium, or low-sodium soups. Unsalted popcorn and pretzels. Low-salt or salt-free chips. The items listed above may not be a complete list of foods and beverages you can eat. Contact a dietitian for more information. What foods should I avoid? Vegetables Sauerkraut, pickled vegetables, and relishes. Olives. [place] fries. Onion rings. Regular canned vegetables (not low-sodium or reduced-sodium). Regular canned tomato sauce and paste (not low-sodium or reduced-sodium). Regular tomato and vegetable juice (not low-sodium or reduced-sodium). Frozen vegetables in sauces. Grains Instant hot cereals. Bread stuffing, pancake, and biscuit mixes. Croutons. Seasoned rice or pasta mixes. Noodle soup cups. Boxed or frozen macaroni and cheese. Regular salted crackers. Self-rising flour. Meats and other proteins Meat or fish that is salted, canned, smoked, spiced, or pickled. Precooked or cured meat, such as sausages or meat loaves. Tomasa Blase. Ham. Pepperoni. Hot dogs. Corned beef. Chipped beef. Salt pork. Jerky. Pickled herring. Anchovies and sardines. Regular canned tuna. Salted nuts. Dairy Processed cheese and cheese  spreads. Hard cheeses. Cheese curds. Blue cheese. Feta cheese. String cheese. Regular cottage cheese. Buttermilk. Canned milk. Fats and oils Salted butter. Regular margarine. Ghee. Bacon fat. Seasonings and condiments Onion salt, garlic salt, seasoned salt, table salt, and sea salt. Canned and packaged gravies. Worcestershire sauce. Tartar sauce. Barbecue sauce. Teriyaki sauce. Soy sauce, including reduced-sodium. Steak sauce. Fish sauce. Oyster sauce. Cocktail sauce. Horseradish that you find on the shelf. Regular ketchup and mustard. Meat flavorings and tenderizers. Bouillon cubes. Hot sauce. Pre-made or packaged marinades. Pre-made or packaged taco seasonings. Relishes. Regular salad dressings. Salsa. Other foods Salted popcorn and pretzels. Corn chips and puffs. Potato and tortilla chips. Canned or dried soups. Pizza. Frozen entrees and pot pies. The items listed above may not be a complete list of foods and beverages you should avoid. Contact a dietitian for more information. Summary Eating less sodium can help lower your blood pressure, reduce swelling, and protect your heart, liver, and kidneys. Most people on this plan should limit their sodium intake to 1,500-2,000 mg (milligrams) of sodium each day. Canned, boxed, and frozen foods are high in sodium. Restaurant foods, fast foods, and pizza are also very high in sodium. You also get sodium by adding salt to food. Try to cook at home, eat more fresh fruits and vegetables, and eat less fast food and canned, processed, or prepared foods. This information is not intended to replace advice given to you by your health care provider. Make sure you discuss any questions you have with your health care provider. Document Revised: 09/08/2019 Document Reviewed: 09/03/2019 Elsevier Patient Education  2023 [employer]   Heart-Healthy Eating Plan Many factors influence your heart health, including eating and exercise habits. Heart health is also  called coronary health. Coronary risk increases with abnormal blood fat (lipid) levels. A heart-healthy eating plan includes limiting unhealthy fats, increasing healthy fats, limiting salt (sodium) intake, and making other diet and lifestyle changes. What is my plan? Your health care provider may recommend that: You limit your fat intake to _________% or less of your total calories each day. You limit your saturated fat intake to _________% or less of your total calories each day. You limit the amount of cholesterol in your diet to less than _________ mg per day. You limit the amount of sodium in your diet to less than _________ mg per day. What are tips for following this plan? Cooking Cook foods using methods other than frying. Baking, boiling, grilling, and broiling are all good options. Other ways to reduce fat include: Removing the skin from poultry. Removing all visible fats from meats. Steaming vegetables in water or broth. Meal planning  At meals, imagine dividing your plate into fourths: Fill one-half of your plate with vegetables and green salads. Fill one-fourth of your plate with whole grains. Fill one-fourth of your plate with lean protein foods. Eat 2-4 cups of vegetables per day. One cup of vegetables equals 1 cup (91 g) broccoli or cauliflower florets, 2 medium carrots, 1 large bell pepper, 1 large sweet potato, 1 large tomato, 1 medium white potato, 2 cups (150 g) raw leafy greens. Eat 1-2 cups of fruit per day. One cup of fruit equals 1 small apple, 1 large banana, 1 cup (237 g) mixed fruit, 1 large orange,  cup (82 g) dried fruit, 1 cup (240 mL) 100% fruit juice. Eat more foods that contain soluble fiber. Examples include apples, broccoli, carrots, beans, peas, and barley. Aim to get 25-30 g of fiber per day. Increase your consumption of legumes, nuts, and seeds to 4-5 servings per week. One serving of dried beans or legumes equals  cup (90 g) cooked, 1 serving of nuts  is  oz (12 almonds, 24 pistachios, or 7 walnut halves), and 1 serving of seeds equals  oz (8 g). Fats Choose healthy fats more often. Choose monounsaturated and polyunsaturated fats, such as olive and canola oils, avocado oil, flaxseeds, walnuts, almonds, and seeds. Eat more omega-3 fats. Choose salmon, mackerel, sardines, tuna, flaxseed oil, and ground flaxseeds. Aim to eat fish at least 2 times each week. Check food labels carefully to identify foods with trans fats or high amounts of saturated fat. Limit saturated fats. These are found in animal products, such as meats, butter, and cream. Plant sources of saturated fats include palm oil, palm kernel oil, and coconut oil. Avoid foods with partially hydrogenated oils in them. These contain trans fats. Examples are stick margarine, some tub margarines, cookies, crackers, and other baked goods. Avoid fried foods. General information Eat more home-cooked food and less restaurant, buffet, and fast food. Limit or avoid alcohol. Limit foods that are high in added sugar and simple starches such as foods made using white refined flour (white breads, pastries, sweets). Lose weight if you are overweight. Losing just 5-10% of your body weight can help your overall health and prevent diseases such as diabetes and heart disease. Monitor your sodium intake, especially if you have high blood pressure. Talk with your health care provider about your sodium intake. Try to incorporate more vegetarian meals weekly. What foods should I eat?  Fruits All fresh, canned (in natural juice), or frozen fruits. Vegetables Fresh or frozen vegetables (raw, steamed, roasted, or grilled). Green salads. Grains Most grains. Choose whole wheat and whole grains most of the time. Rice and pasta, including brown rice and pastas made with whole wheat. Meats and other proteins Lean, well-trimmed beef, veal, pork, and lamb. Chicken and [place] without skin. All fish and shellfish.  Wild duck, rabbit, pheasant, and venison. Egg whites or low-cholesterol egg substitutes. Dried beans, peas, lentils, and tofu. Seeds and most nuts. Dairy Low-fat or nonfat cheeses, including ricotta and mozzarella. Skim or 1% milk (liquid, powdered, or evaporated). Buttermilk made with low-fat milk. Nonfat or low-fat yogurt. Fats and oils Non-hydrogenated (trans-free) margarines. Vegetable oils, including soybean, sesame, sunflower, olive, avocado, peanut, safflower, corn, canola, and cottonseed. Salad dressings or mayonnaise made with a vegetable oil. Beverages Water (mineral or sparkling). Coffee and tea. Unsweetened ice tea. Diet beverages. Sweets and desserts Sherbet, gelatin, and fruit ice. Small amounts of dark chocolate. Limit all sweets and desserts. Seasonings and condiments All seasonings and condiments. The items listed above may not be a complete list of foods and beverages you can eat. Contact a dietitian for more options. What foods should I avoid? Fruits Canned fruit in heavy syrup. Fruit in cream or butter sauce. Fried fruit. Limit coconut. Vegetables Vegetables cooked in cheese, cream, or butter sauce. Fried vegetables. Grains Breads made with saturated or trans fats, oils, or whole milk. Croissants. Sweet rolls. Donuts. High-fat crackers, such as cheese crackers and chips. Meats and other proteins Fatty meats, such as hot dogs, ribs, sausage, bacon, rib-eye roast or steak. High-fat deli meats, such as salami and bologna. Caviar. Domestic duck and goose. Organ meats, such as liver. Dairy Cream, sour cream, cream cheese, and creamed cottage cheese. Whole-milk cheeses. Whole or 2% milk (liquid, evaporated, or condensed). Whole buttermilk. Cream sauce or high-fat cheese sauce. Whole-milk yogurt. Fats and oils Meat fat, or shortening. Cocoa butter, hydrogenated oils, palm oil, coconut oil, palm kernel oil. Solid fats and shortenings, including bacon fat, salt pork, lard, and  butter. Nondairy cream substitutes. Salad dressings with cheese or sour cream. Beverages Regular sodas and any drinks with added sugar. Sweets and desserts Frosting. Pudding. Cookies. Cakes. Pies. Milk chocolate or white chocolate. Buttered syrups. Full-fat ice cream or ice cream drinks. The items listed above may not be a complete list of foods and beverages to avoid. Contact a dietitian for more information. Summary Heart-healthy meal planning includes limiting unhealthy fats, increasing healthy fats, limiting salt (sodium) intake and making other diet and lifestyle changes. Lose weight if you are overweight. Losing just 5-10% of your body weight can help your overall health and prevent diseases such as diabetes and heart disease. Focus on eating a balance of foods, including fruits and vegetables, low-fat or nonfat dairy, lean protein, nuts and legumes, whole grains, and heart-healthy oils and fats. This information is not intended to replace advice given to you by your health care provider. Make sure you discuss any questions you have with your health care provider. Document Revised: 11/07/2021 Document Reviewed: 11/07/2021 Elsevier Patient Education  2023 [employer].

## 2023-02-06 NOTE — Progress Notes (Signed)
Office Visit    Patient Name: [name] Date of Encounter: 02/06/2023  PCP:  No primary care provider on file.   [facility]  Cardiologist:  Donato Schultz, MD  Advanced Practice Provider:  No care team member to display Electrophysiologist:  None   HPI    [name] is a 67 y.o. female with past medical history of hypertension, pure hypercholesterolemia, and anxiety presents today for follow-up appointment.  She saw Dr. Katrinka Blazing 11/16/2021 for an annual Medicare wellness exam.  An abnormality was found on her EKG and they were concerned for atrial flutter.  She was referred to cardiology.  She was last seen by Dr. Anne Fu 12/28/2021 and at that time was doing well.  She endorsed psoriatic and rheumatoid arthritis that was officially diagnosed 67 years old.  She has been taking medications for these conditions since then.  However, she reports that she had these conditions that she was a teenager.  She wonders if her high heart rate is due to stress coming to the doctor's office.  Currently, she does not record her blood pressure at home regularly.  Yesterday at her infusion she reports her blood pressure was initially 140/88 but decreased to 110/70.  Her sister passed away at 12 years old.  She also had rheumatoid arthritis and a "hole in her heart".  Father had his first MI in his 19s and died in his 27s of either MI or stroke.  Today, she is worried about her aorta and wants to go over her CT scan.  We discussed the scan and how we need to annually perform a CT scan to look at her aorta to make sure it is not getting any larger.  Blood pressure control is very important and I have asked her to monitor this at home.  She was 126/82 today.  She is compliant with all her medications.  She is tachycardic and heart rate is 109.  She does not have any palpitations.  However, I do think it would be wise to start a beta-blocker to try and offload some of the  workload of the heart.  She has agreed to this today.  She is taking 3 different medications for her RA and this has really affected her physical activity level.  Otherwise no cardiac issues.  Reports no shortness of breath nor dyspnea on exertion. Reports no chest pain, pressure, or tightness. No edema, orthopnea, PND. Reports no palpitations.    Past Medical History    Past Medical History:  Diagnosis Date   Anxiety    Arthritis    Arthritis with psoriasis    Bruises easily    Complication of anesthesia    pt states awaken during operating room during right knee surgery    Concussion    2015   Difficulty sleeping    Fall    2015   Family history of ischemic heart disease    HA (headache)    migraine history secondary to fall in 2015 no current issues   History of bronchitis    1990's    History of nonmelanoma skin cancer    History of urinary tract infection    Hypercholesterolemia    Hypertension    Insomnia    Low back pain    Osteoporosis    PONV (postoperative nausea and vomiting)    Postmenopausal bleeding    Psoriasis    Right atrial enlargement    Squamous cell carcinoma  right ankle    Vaginal atrophy    Wears glasses    Past Surgical History:  Procedure Laterality Date   BACK SURGERY  2006   fusion C2-C6    CESAREAN SECTION     HAND SURGERY     multiple surgeries due to arthritis   TOTAL KNEE ARTHROPLASTY Right 11/03/2013   Procedure: RIGHT TOTAL KNEE ARTHROPLASTY;  Surgeon: [name], MD;  Location: [facility];  Service: Orthopedics;  Laterality: Right;   TOTAL KNEE ARTHROPLASTY Left 11/01/2015   Procedure: LEFT TOTAL KNEE ARTHROPLASTY;  Surgeon: Durene Romans, MD;  Location: [facility];  Service: Orthopedics;  Laterality: Left;    Allergies  Allergies  Allergen Reactions   Arava [Leflunomide] Diarrhea   Clinoril [Sulindac]     JAUDICE    Codeine Nausea And Vomiting   Other     clineril caused hepatitis b    Plaquenil [Hydroxychloroquine]  Diarrhea     EKGs/Labs/Other Studies Reviewed:   The following studies were reviewed today:  CTA 01/09/2023\  IMPRESSION: 4.6 cm ascending thoracic aortic aneurysm. Recommend semi-annual imaging followup by CTA or MRA and referral to cardiothoracic surgery if not already obtained. This recommendation follows 2010 ACCF/AHA/AATS/ACR/ASA/SCA/SCAI/SIR/STS/SVM Guidelines for the Diagnosis and Management of Patients With Thoracic Aortic Disease. Circulation. 2010; 121: R604-V409. Aortic aneurysm NOS (ICD10-I71.9)    EKG:  EKG is  ordered today.  The ekg ordered today demonstrates sinus tachycardia rate 109 bpm  Recent Labs: 05/01/2022: ALT 19 01/09/2023: Creatinine, Ser 0.80  Recent Lipid Panel    Component Value Date/Time   CHOL 152 05/01/2022 1109   TRIG 155 (H) 05/01/2022 1109   HDL 69 05/01/2022 1109   CHOLHDL 2.2 05/01/2022 1109   LDLCALC 57 05/01/2022 1109    Home Medications   Current Meds  Medication Sig   CALCIUM CARBONATE-VITAMIN D PO Take 1 tablet by mouth daily.   certolizumab pegol (CIMZIA) 2 X 200 MG KIT Inject 200 mg into the skin. Inject 200 mg into the skin every month   denosumab (PROLIA) 60 MG/ML SOSY injection Inject 60 mg into the skin every 6 (six) months.   golimumab (SIMPONI ARIA) 50 MG/4ML SOLN injection Inject 50 mg into the vein See admin instructions. Every 2 months   lisinopril (PRINIVIL,ZESTRIL) 10 MG tablet Take 10 mg by mouth daily with breakfast.   metoprolol succinate (TOPROL XL) 25 MG 24 hr tablet Take 1 tablet (25 mg total) by mouth daily.   Multiple Vitamin (MULTIVITAMIN WITH MINERALS) TABS tablet Take 1 tablet by mouth daily.   predniSONE (DELTASONE) 5 MG tablet Take 5 mg by mouth daily.   rosuvastatin (CRESTOR) 10 MG tablet TAKE 1 TABLET BY MOUTH EVERY DAY   tiZANidine (ZANAFLEX) 4 MG tablet Take 4 mg by mouth every 8 (eight) hours as needed.   traMADol (ULTRAM) 50 MG tablet Take 50 mg by mouth as needed.   zolpidem (AMBIEN) 10 MG tablet  Take 10 mg by mouth at bedtime as needed for sleep.     Review of Systems      All other systems reviewed and are otherwise negative except as noted above.  Physical Exam    VS:  BP 126/82   Pulse (!) 109   Ht  (1.549 m)   Wt 118 lb 12.8 oz (53.9 kg)   SpO2 98%   BMI 22.45 kg/m  , BMI Body mass index is 22.45 kg/m.  Wt Readings from Last 3 Encounters:  02/06/23 118 lb 12.8 oz (  53.9 kg)  12/28/21 123 lb 6.4 oz (56 kg)  05/08/16 125 lb (56.7 kg)     GEN: Well nourished, well developed, in no acute distress. HEENT: normal. Neck: Supple, no JVD, carotid bruits, or masses. Cardiac: sinus tachycardia, no murmurs, rubs, or gallops. No clubbing, cyanosis, edema.  Radials/PT 2+ and equal bilaterally.  Respiratory:  Respirations regular and unlabored, clear to auscultation bilaterally. GI: Soft, nontender, nondistended. MS: No deformity or atrophy. Skin: Warm and dry, no rash. Neuro:  Strength and sensation are intact. Psych: Normal affect.  Assessment & Plan    Abnormal EKG -Sinus tachycardia on EKG rate 109 -Plan to start metoprolol succinate 25 mg daily -Please keep track of your blood pressure -Continue other medications including lisinopril 10 mg daily, Crestor 10 mg daily  Ascending aortic aneurysm -annual CTA -Reviewed her most recent CTA with her -Discussed avoiding bearing down and lifting heavy weight -Discussed watching her blood pressure closely and tracking daily  Rheumatoid arthritis -This has significantly limited her physical activity level -She is now taking prednisone daily        Disposition: Follow up 6 months with Donato Schultz, MD or APP.  Signed, Sharlene Dory, PA-C 02/06/2023, 3:26 PM [facility]

## 2023-02-26 ENCOUNTER — Ambulatory Visit
Admission: RE | Admit: 2023-02-26 | Discharge: 2023-02-26 | Disposition: A | Discharge status: Home / Self Care | Payer: Medicare Other | Source: Ambulatory Visit | Attending: Family Medicine | Admitting: Family Medicine

## 2023-02-26 DIAGNOSIS — Z1231 Encounter for screening mammogram for malignant neoplasm of breast: Secondary | ICD-10-CM

## 2023-02-27 ENCOUNTER — Other Ambulatory Visit: Payer: Self-pay | Admitting: Cardiology

## 2023-03-29 ENCOUNTER — Ambulatory Visit (INDEPENDENT_AMBULATORY_CARE_PROVIDER_SITE_OTHER): Payer: Medicare Other | Admitting: Podiatry

## 2023-03-29 ENCOUNTER — Ambulatory Visit (INDEPENDENT_AMBULATORY_CARE_PROVIDER_SITE_OTHER): Payer: Medicare Other

## 2023-03-29 DIAGNOSIS — M19072 Primary osteoarthritis, left ankle and foot: Secondary | ICD-10-CM | POA: Diagnosis not present

## 2023-03-29 DIAGNOSIS — M778 Other enthesopathies, not elsewhere classified: Secondary | ICD-10-CM

## 2023-03-29 DIAGNOSIS — M19071 Primary osteoarthritis, right ankle and foot: Secondary | ICD-10-CM | POA: Diagnosis not present

## 2023-03-29 MED ORDER — TRIAMCINOLONE ACETONIDE 10 MG/ML IJ SUSP
10.0000 mg | Freq: Once | INTRAMUSCULAR | Status: AC
  Administered 2023-03-29: 10 mg

## 2023-03-29 NOTE — Progress Notes (Signed)
Subjective:   Patient ID: [name], female   DOB: 67 y.o.   MRN: [id number]   HPI Chief Complaint  Patient presents with   Foot Pain    Patient is being seen by [facility] and they obtained X-rays of her feet and it showed arthritis to bilateral feet. Patient has pain to bilateral feet but the right foot is the worst. Patient is requesting an injection to alleviate the pain.    67 year old female presents for above concerns.  She requested an injection in her foot given chronic pain.  She has severe deformity of her feet given her arthritis patient referred for trial injection.  No recent injuries.   Review of Systems  All other systems reviewed and are negative.  Past Medical History:  Diagnosis Date   Anxiety    Arthritis    Arthritis with psoriasis (HCC)    Bruises easily    Complication of anesthesia    pt states awaken during operating room during right knee surgery    Concussion    2015   Difficulty sleeping    Fall    2015   Family history of ischemic heart disease    HA (headache)    migraine history secondary to fall in 2015 no current issues   History of bronchitis    1990's    History of nonmelanoma skin cancer    History of urinary tract infection    Hypercholesterolemia    Hypertension    Insomnia    Low back pain    Osteoporosis    PONV (postoperative nausea and vomiting)    Postmenopausal bleeding    Psoriasis    Right atrial enlargement    Squamous cell carcinoma    right ankle    Vaginal atrophy    Wears glasses     Past Surgical History:  Procedure Laterality Date   BACK SURGERY  2006   fusion C2-C6    CESAREAN SECTION     HAND SURGERY     multiple surgeries due to arthritis   TOTAL KNEE ARTHROPLASTY Right 11/03/2013   Procedure: RIGHT TOTAL KNEE ARTHROPLASTY;  Surgeon: [name], MD;  Location: [facility];  Service: Orthopedics;  Laterality: Right;   TOTAL KNEE ARTHROPLASTY Left 11/01/2015   Procedure: LEFT TOTAL  KNEE ARTHROPLASTY;  Surgeon: Durene Romans, MD;  Location: [facility];  Service: Orthopedics;  Laterality: Left;     Current Outpatient Medications:    CALCIUM CARBONATE-VITAMIN D PO, Take 1 tablet by mouth daily., Disp: , Rfl:    certolizumab pegol (CIMZIA) 2 X 200 MG KIT, Inject 200 mg into the skin. Inject 200 mg into the skin every month, Disp: , Rfl:    denosumab (PROLIA) 60 MG/ML SOSY injection, Inject 60 mg into the skin every 6 (six) months., Disp: , Rfl:    golimumab (SIMPONI ARIA) 50 MG/4ML SOLN injection, Inject 50 mg into the vein See admin instructions. Every 2 months, Disp: , Rfl:    lisinopril (PRINIVIL,ZESTRIL) 10 MG tablet, Take 10 mg by mouth daily with breakfast., Disp: , Rfl:    metoprolol succinate (TOPROL XL) 25 MG 24 hr tablet, Take 1 tablet (25 mg total) by mouth daily., Disp: 90 tablet, Rfl: 3   Multiple Vitamin (MULTIVITAMIN WITH MINERALS) TABS tablet, Take 1 tablet by mouth daily., Disp: , Rfl:    predniSONE (DELTASONE) 5 MG tablet, Take 5 mg by mouth daily., Disp: , Rfl:    rosuvastatin (CRESTOR) 10 MG tablet,  TAKE 1 TABLET BY MOUTH EVERY DAY, Disp: 90 tablet, Rfl: 3   tiZANidine (ZANAFLEX) 4 MG tablet, Take 4 mg by mouth every 8 (eight) hours as needed., Disp: , Rfl:    traMADol (ULTRAM) 50 MG tablet, Take 50 mg by mouth as needed., Disp: , Rfl:    zolpidem (AMBIEN) 10 MG tablet, Take 10 mg by mouth at bedtime as needed for sleep., Disp: , Rfl:   Allergies  Allergen Reactions   Arava [Leflunomide] Diarrhea   Clinoril [Sulindac]     JAUDICE    Codeine Nausea And Vomiting   Other     clineril caused hepatitis b    Plaquenil [Hydroxychloroquine] Diarrhea          Objective:  Physical Exam  General: AAO x3, NAD  Dermatological: Skin is warm, dry and supple bilateral.  There are no open sores, no preulcerative lesions, no rash or signs of infection present.  Vascular: Dorsalis Pedis artery and Posterior Tibial artery pedal pulses are 2/4 bilateral with  immedate capillary fill time.  There is no pain with calf compression, swelling, warmth, erythema.   Neruologic: Grossly intact via light touch bilateral.    Musculoskeletal: Severe deformity present bilaterally.  Significant pronation with spine, prominence of medial aspect of foot resulting in minimal callus formation.  The majority of tenderness is actually localized along the right sinus tarsi area.  Range of motion of subtalar joint.  Decreased range of motion of the ankle joint.  Gait: Unassisted, Nonantalgic.       Assessment:   67 year old female with severe deformity, capsulitis     Plan:  -Treatment options discussed including all alternatives, risks, and complications -Etiology of symptoms were discussed -X-rays were obtained and reviewed with the patient.  3 views bilateral feet were obtained.  Severe deformities noted with arthritic changes present severely to the rear foot and collapse of the rear foot. -She was to proceed with injection.  Discussed this with no guarantee and she is well aware of this.  Most of her pain on the lateral aspect of her right foot.  I cleaned the skin with Betadine, alcohol mixture 1 cc Kenalog 10, 0.5 cc Marcaine plain, 0.5 cc of lidocaine plain was infiltrated around the sinus tarsi on the area of maximal tenderness without complications.  Postinjection care discussed. -Donut pad for the area of the bony prominence on the right foot.     Vivi Barrack DPM

## 2023-10-23 ENCOUNTER — Ambulatory Visit: Payer: Medicare Other | Attending: Cardiology | Admitting: Cardiology

## 2023-10-23 ENCOUNTER — Encounter: Payer: Self-pay | Admitting: Cardiology

## 2023-10-23 VITALS — BP 126/84 | HR 113 | Ht 61.0 in | Wt 118.6 lb

## 2023-10-23 DIAGNOSIS — I7789 Other specified disorders of arteries and arterioles: Secondary | ICD-10-CM | POA: Insufficient documentation

## 2023-10-23 DIAGNOSIS — M069 Rheumatoid arthritis, unspecified: Secondary | ICD-10-CM | POA: Insufficient documentation

## 2023-10-23 NOTE — Patient Instructions (Signed)
 Medication Instructions:  Your physician recommends that you continue on your current medications as directed. Please refer to the Current Medication list given to you today.  *If you need a refill on your cardiac medications before your next appointment, please call your pharmacy*   Lab Work: [facility] prior to April CT Aorta  If you have labs (blood work) drawn today and your tests are completely normal, you will receive your results only by: MyChart Message (if you have MyChart) OR A paper copy in the mail If you have any lab test that is abnormal or we need to change your treatment, we will call you to review the results.   Testing/Procedures: APRIL 2025- - Your physician has requested that you have a CT Aorta.    Follow-Up: At [facility], you and your health needs are our priority.  As part of our continuing mission to provide you with exceptional heart care, we have created designated Provider Care Teams.  These Care Teams include your primary Cardiologist (physician) and Advanced Practice Providers (APPs -  Physician Assistants and Nurse Practitioners) who all work together to provide you with the care you need, when you need it.  We recommend signing up for the patient portal called MyChart.  Sign up information is provided on this After Visit Summary.  MyChart is used to connect with patients for Virtual Visits (Telemedicine).  Patients are able to view lab/test results, encounter notes, upcoming appointments, etc.  Non-urgent messages can be sent to your provider as well.   To learn more about what you can do with MyChart, go to [email or web address].    Your next appointment:   15 month(s)  Provider:   Oneil Parchment, MD

## 2023-10-23 NOTE — Progress Notes (Signed)
 Cardiology Office Note:  .   Date:  10/23/2023  ID:  [name], DOB 1956-02-09, MRN [id number] PCP: Claudene Pellet, MD  [facility] Providers Cardiologist:  Oneil Parchment, MD     History of Present Illness: .   [name] is a 68 y.o. female Discussed with the use of AI scribe   History of Present Illness   The patient, a 68 year old individual with an ascending thoracic aortic aneurysm measuring 4.6 cm, was identified during a coronary calcium  score test. She is currently on metoprolol  succinate 25 mg daily and Crestor  10 mg daily for hyperlipidemia, with an LDL of 112. The patient's heart rate was mildly tachycardic, with a rate of 113 beats per minute, slightly higher than the previous rate of 109 beats per minute.  The patient reports feeling well overall, with no significant concerns about her heart health. She is aware of the aneurysm and understands that it is being monitored due to its moderate size. The patient is not experiencing any symptoms related to the aneurysm and is not overly worried about it on a daily basis.  The patient is also managing rheumatoid arthritis, which causes significant pain and limits her physical activity. Despite this, she is making efforts to stay active and has recently started using a pedal exerciser. She is also trying to follow a Mediterranean diet and avoid processed foods.  The patient's family history includes a father who died of a heart attack or stroke and a sister who had a heart attack at a young age. The patient is aware of this history and is proactive in managing her cardiovascular health.           Studies Reviewed: .        Results   LABS LDL: 112 (12/13/2022) Creatinine: 0.8  RADIOLOGY CTA of the aorta: Ascending thoracic aortic aneurysm, 4.6 cm (01/09/2023) Coronary calcium  score: 12 which is 61 percentile (01/12/2022)     Risk Assessment/Calculations:            Physical Exam:   VS:  BP 126/84    Pulse (!) 113   Ht 5' 1 (1.549 m)   Wt 118 lb 9.6 oz (53.8 kg)   SpO2 99%   BMI 22.41 kg/m    Wt Readings from Last 3 Encounters:  10/23/23 118 lb 9.6 oz (53.8 kg)  02/06/23 118 lb 12.8 oz (53.9 kg)  12/28/21 123 lb 6.4 oz (56 kg)    GEN: Well nourished, well developed in no acute distress NECK: No JVD; No carotid bruits CARDIAC: Tachy reg, no murmurs, no rubs, no gallops RESPIRATORY:  Clear to auscultation without rales, wheezing or rhonchi  ABDOMEN: Soft, non-tender, non-distended EXTREMITIES:  No edema; RA joint changes noted  ASSESSMENT AND PLAN: .    Assessment and Plan    Ascending Thoracic Aortic Aneurysm Ascending thoracic aortic aneurysm measuring 4.6 cm, noted on CTA on 01/09/2023. Currently asymptomatic with good blood pressure control. Heart rate mildly tachycardic (113 bpm today, previously 109 bpm). No immediate need for surgical intervention as the aneurysm is below the threshold for surgery (5-5.5 cm). Discussed genetic predisposition and the importance of monitoring. Advised against heavy lifting and straining. Explained that surgery is considered when the aneurysm reaches 5-5.5 cm. Discussed the possibility of using non-contrast CT for future monitoring if initial results are consistent, but emphasized the importance of contrast for accurate imaging. Creatinine level is normal (0.8), indicating good kidney function for contrast use. - Order CTA  of the aorta with contrast in early April 2025 - Schedule follow-up visit one year after the scan to review results and monitor aneurysm progression  Tachycardia Mild tachycardia with heart rate of 113 bpm today, previously 109 bpm. Managed with metoprolol  succinate 25 mg daily. Discussed the importance of maintaining good heart rate control to prevent exacerbation of the aortic aneurysm. - Continue metoprolol  succinate 25 mg daily  Hyperlipidemia/Coronary artery calcification (12) Hyperlipidemia managed with rosuvastatin  10  mg daily. LDL level was 112 mg/dL on 97/71/7975. Discussed the benefits of rosuvastatin  in stabilizing plaque and potentially aiding in aortic stability. - Continue rosuvastatin  10 mg daily  General Health Maintenance Discussed the importance of a Mediterranean diet and regular physical activity to support overall cardiovascular health. Advised to avoid processed foods and maintain a plant-based diet as much as possible. - Adopt a Mediterranean diet - Engage in regular physical activity, avoiding strenuous activities that may strain the aorta  Follow-up - Schedule follow-up visit one year after the CTA scan in April 2025.             Signed, Oneil Parchment, MD

## 2023-11-08 ENCOUNTER — Other Ambulatory Visit: Payer: Self-pay | Admitting: Physician Assistant

## 2023-12-17 ENCOUNTER — Other Ambulatory Visit: Payer: Self-pay | Admitting: Family Medicine

## 2023-12-17 DIAGNOSIS — E2839 Other primary ovarian failure: Secondary | ICD-10-CM

## 2024-01-21 ENCOUNTER — Other Ambulatory Visit (HOSPITAL_COMMUNITY): Payer: Medicare Other

## 2024-01-21 ENCOUNTER — Ambulatory Visit (HOSPITAL_BASED_OUTPATIENT_CLINIC_OR_DEPARTMENT_OTHER)
Admission: RE | Admit: 2024-01-21 | Discharge: 2024-01-21 | Disposition: A | Discharge status: Home / Self Care | Payer: Medicare Other | Source: Ambulatory Visit | Attending: Physician Assistant | Admitting: Physician Assistant

## 2024-01-21 DIAGNOSIS — I719 Aortic aneurysm of unspecified site, without rupture: Secondary | ICD-10-CM | POA: Diagnosis present

## 2024-01-21 MED ORDER — IOHEXOL 350 MG/ML SOLN
100.0000 mL | Freq: Once | INTRAVENOUS | Status: AC | PRN
  Administered 2024-01-21: 75 mL via INTRAVENOUS

## 2024-02-01 ENCOUNTER — Encounter (HOSPITAL_BASED_OUTPATIENT_CLINIC_OR_DEPARTMENT_OTHER): Payer: Self-pay

## 2024-02-04 ENCOUNTER — Other Ambulatory Visit: Payer: Self-pay | Admitting: *Deleted

## 2024-02-04 DIAGNOSIS — I719 Aortic aneurysm of unspecified site, without rupture: Secondary | ICD-10-CM

## 2024-04-03 ENCOUNTER — Other Ambulatory Visit: Payer: Self-pay | Admitting: Physician Assistant

## 2024-04-07 ENCOUNTER — Other Ambulatory Visit: Payer: Self-pay | Admitting: Family Medicine

## 2024-04-07 DIAGNOSIS — Z1231 Encounter for screening mammogram for malignant neoplasm of breast: Secondary | ICD-10-CM

## 2024-04-08 ENCOUNTER — Other Ambulatory Visit: Payer: Self-pay

## 2024-04-08 MED ORDER — METOPROLOL SUCCINATE ER 25 MG PO TB24: 25.0000 mg | ORAL_TABLET | Freq: Every day | ORAL | 1 refills | Status: DC

## 2024-04-29 ENCOUNTER — Ambulatory Visit
Admission: RE | Admit: 2024-04-29 | Discharge: 2024-04-29 | Disposition: A | Discharge status: Home / Self Care | Source: Ambulatory Visit | Attending: Family Medicine | Admitting: Family Medicine

## 2024-04-29 DIAGNOSIS — Z1231 Encounter for screening mammogram for malignant neoplasm of breast: Secondary | ICD-10-CM

## 2024-06-26 ENCOUNTER — Ambulatory Visit (HOSPITAL_BASED_OUTPATIENT_CLINIC_OR_DEPARTMENT_OTHER)
Admission: RE | Admit: 2024-06-26 | Discharge: 2024-06-26 | Disposition: A | Discharge status: Home / Self Care | Source: Ambulatory Visit | Attending: Family Medicine | Admitting: Family Medicine

## 2024-06-26 DIAGNOSIS — E2839 Other primary ovarian failure: Secondary | ICD-10-CM | POA: Diagnosis present

## 2024-08-18 ENCOUNTER — Ambulatory Visit (HOSPITAL_COMMUNITY)
Admission: RE | Admit: 2024-08-18 | Discharge: 2024-08-18 | Disposition: A | Discharge status: Home / Self Care | Source: Ambulatory Visit | Attending: Cardiology | Admitting: Cardiology

## 2024-08-18 DIAGNOSIS — I728 Aneurysm of other specified arteries: Secondary | ICD-10-CM | POA: Diagnosis not present

## 2024-08-18 DIAGNOSIS — I719 Aortic aneurysm of unspecified site, without rupture: Secondary | ICD-10-CM | POA: Diagnosis present

## 2024-08-18 DIAGNOSIS — I7121 Aneurysm of the ascending aorta, without rupture: Secondary | ICD-10-CM | POA: Insufficient documentation

## 2024-08-18 MED ORDER — IOHEXOL 350 MG/ML SOLN
80.0000 mL | Freq: Once | INTRAVENOUS | Status: AC | PRN
  Administered 2024-08-18: 80 mL via INTRAVENOUS

## 2024-08-19 ENCOUNTER — Other Ambulatory Visit

## 2024-08-26 ENCOUNTER — Ambulatory Visit: Payer: Self-pay | Admitting: Cardiology

## 2024-08-26 DIAGNOSIS — Z01812 Encounter for preprocedural laboratory examination: Secondary | ICD-10-CM

## 2024-08-26 DIAGNOSIS — I7789 Other specified disorders of arteries and arterioles: Secondary | ICD-10-CM

## 2024-08-26 DIAGNOSIS — I351 Nonrheumatic aortic (valve) insufficiency: Secondary | ICD-10-CM

## 2024-09-08 ENCOUNTER — Encounter (HOSPITAL_COMMUNITY): Payer: Self-pay | Admitting: General Surgery

## 2024-09-08 ENCOUNTER — Other Ambulatory Visit: Payer: Self-pay

## 2024-09-08 ENCOUNTER — Emergency Department (HOSPITAL_COMMUNITY)

## 2024-09-08 ENCOUNTER — Observation Stay (HOSPITAL_COMMUNITY): Admission: EM | Admit: 2024-09-08 | Discharge: 2024-09-09 | Disposition: A | Discharge status: Home / Self Care

## 2024-09-08 DIAGNOSIS — L4050 Arthropathic psoriasis, unspecified: Secondary | ICD-10-CM | POA: Diagnosis not present

## 2024-09-08 DIAGNOSIS — S270XXA Traumatic pneumothorax, initial encounter: Secondary | ICD-10-CM | POA: Diagnosis not present

## 2024-09-08 DIAGNOSIS — S22059A Unspecified fracture of T5-T6 vertebra, initial encounter for closed fracture: Secondary | ICD-10-CM | POA: Diagnosis not present

## 2024-09-08 DIAGNOSIS — S0101XA Laceration without foreign body of scalp, initial encounter: Secondary | ICD-10-CM | POA: Diagnosis not present

## 2024-09-08 DIAGNOSIS — Z85828 Personal history of other malignant neoplasm of skin: Secondary | ICD-10-CM | POA: Diagnosis not present

## 2024-09-08 DIAGNOSIS — Y92019 Unspecified place in single-family (private) house as the place of occurrence of the external cause: Secondary | ICD-10-CM | POA: Diagnosis not present

## 2024-09-08 DIAGNOSIS — W109XXA Fall (on) (from) unspecified stairs and steps, initial encounter: Secondary | ICD-10-CM | POA: Diagnosis not present

## 2024-09-08 DIAGNOSIS — I10 Essential (primary) hypertension: Secondary | ICD-10-CM | POA: Diagnosis not present

## 2024-09-08 DIAGNOSIS — R519 Headache, unspecified: Secondary | ICD-10-CM | POA: Diagnosis present

## 2024-09-08 DIAGNOSIS — F1090 Alcohol use, unspecified, uncomplicated: Secondary | ICD-10-CM | POA: Diagnosis not present

## 2024-09-08 DIAGNOSIS — E785 Hyperlipidemia, unspecified: Secondary | ICD-10-CM | POA: Diagnosis not present

## 2024-09-08 DIAGNOSIS — Z79899 Other long term (current) drug therapy: Secondary | ICD-10-CM | POA: Diagnosis not present

## 2024-09-08 DIAGNOSIS — W19XXXA Unspecified fall, initial encounter: Principal | ICD-10-CM

## 2024-09-08 DIAGNOSIS — Z96653 Presence of artificial knee joint, bilateral: Secondary | ICD-10-CM | POA: Diagnosis not present

## 2024-09-08 DIAGNOSIS — J939 Pneumothorax, unspecified: Secondary | ICD-10-CM | POA: Diagnosis present

## 2024-09-08 DIAGNOSIS — S2241XA Multiple fractures of ribs, right side, initial encounter for closed fracture: Secondary | ICD-10-CM

## 2024-09-08 LAB — BASIC METABOLIC PANEL WITH GFR
Anion gap: 15 (ref 5–15)
BUN: 25 mg/dL — ABNORMAL HIGH (ref 8–23)
CO2: 17 mmol/L — ABNORMAL LOW (ref 22–32)
Calcium: 9.4 mg/dL (ref 8.9–10.3)
Chloride: 108 mmol/L (ref 98–111)
Creatinine, Ser: 0.96 mg/dL (ref 0.44–1.00)
GFR, Estimated: >60 mL/min (ref >60)
Glucose, Bld: 151 mg/dL — ABNORMAL HIGH (ref 70–99)
Potassium: 4 mmol/L (ref 3.5–5.1)
Sodium: 140 mmol/L (ref 135–145)

## 2024-09-08 LAB — CBC WITH DIFFERENTIAL/PLATELET
Abs Immature Granulocytes: 0.29 K/uL — ABNORMAL HIGH (ref 0.00–0.07)
Basophils Absolute: 0.1 K/uL (ref 0.0–0.1)
Basophils Relative: 0 %
Eosinophils Absolute: 0 K/uL (ref 0.0–0.5)
Eosinophils Relative: 0 %
HCT: 38.7 % (ref 36.0–46.0)
Hemoglobin: 13 g/dL (ref 12.0–15.0)
Immature Granulocytes: 1 %
Lymphocytes Relative: 10 %
Lymphs Abs: 2.2 K/uL (ref 0.7–4.0)
MCH: 30.8 pg (ref 26.0–34.0)
MCHC: 33.6 g/dL (ref 30.0–36.0)
MCV: 91.7 fL (ref 80.0–100.0)
Monocytes Absolute: 2.6 K/uL — ABNORMAL HIGH (ref 0.1–1.0)
Monocytes Relative: 12 %
Neutro Abs: 16.3 K/uL — ABNORMAL HIGH (ref 1.7–7.7)
Neutrophils Relative %: 77 %
Platelets: 514 K/uL — ABNORMAL HIGH (ref 150–400)
RBC: 4.22 MIL/uL (ref 3.87–5.11)
RDW: 13 % (ref 11.5–15.5)
WBC: 21.5 K/uL — ABNORMAL HIGH (ref 4.0–10.5)
nRBC: 0 % (ref 0.0–0.2)

## 2024-09-08 LAB — HIV ANTIBODY (ROUTINE TESTING W REFLEX): HIV Screen 4th Generation wRfx: NONREACTIVE

## 2024-09-08 MED ORDER — HYDROCODONE-ACETAMINOPHEN 5-325 MG PO TABS
2.0000 | ORAL_TABLET | Freq: Once | ORAL | Status: AC
  Administered 2024-09-08: 2 via ORAL
  Filled 2024-09-08: qty 2

## 2024-09-08 MED ORDER — TIZANIDINE HCL 4 MG PO TABS
4.0000 mg | ORAL_TABLET | Freq: Three times a day (TID) | ORAL | Status: DC | PRN
Start: 2024-09-08 — End: 2024-09-09
  Administered 2024-09-08: 4 mg via ORAL
  Filled 2024-09-08: qty 1

## 2024-09-08 MED ORDER — HYDRALAZINE HCL 20 MG/ML IJ SOLN: 10.0000 mg | INTRAMUSCULAR | Status: DC | PRN

## 2024-09-08 MED ORDER — MORPHINE SULFATE (PF) 4 MG/ML IV SOLN
4.0000 mg | INTRAVENOUS | Status: DC | PRN
  Administered 2024-09-08: 4 mg via INTRAVENOUS
  Filled 2024-09-08 (×2): qty 1

## 2024-09-08 MED ORDER — ZOLPIDEM TARTRATE 5 MG PO TABS
5.0000 mg | ORAL_TABLET | Freq: Every evening | ORAL | Status: DC | PRN
  Administered 2024-09-08: 5 mg via ORAL
  Filled 2024-09-08: qty 1

## 2024-09-08 MED ORDER — LISINOPRIL 10 MG PO TABS
10.0000 mg | ORAL_TABLET | Freq: Every day | ORAL | Status: DC
  Administered 2024-09-09: 10 mg via ORAL
  Filled 2024-09-08: qty 1

## 2024-09-08 MED ORDER — ACETAMINOPHEN 500 MG PO TABS
1000.0000 mg | ORAL_TABLET | Freq: Four times a day (QID) | ORAL | Status: DC
  Administered 2024-09-08 – 2024-09-09 (×3): 1000 mg via ORAL
  Filled 2024-09-08 (×4): qty 2

## 2024-09-08 MED ORDER — METOPROLOL SUCCINATE ER 25 MG PO TB24
25.0000 mg | ORAL_TABLET | Freq: Every day | ORAL | Status: DC
  Administered 2024-09-08 – 2024-09-09 (×2): 25 mg via ORAL
  Filled 2024-09-08 (×2): qty 1

## 2024-09-08 MED ORDER — ONDANSETRON HCL 4 MG/2ML IJ SOLN
4.0000 mg | Freq: Four times a day (QID) | INTRAMUSCULAR | Status: DC | PRN
  Administered 2024-09-08: 4 mg via INTRAVENOUS
  Filled 2024-09-08: qty 2

## 2024-09-08 MED ORDER — ONDANSETRON 4 MG PO TBDP: 4.0000 mg | ORAL_TABLET | Freq: Four times a day (QID) | ORAL | Status: DC | PRN

## 2024-09-08 MED ORDER — LIDOCAINE HCL (PF) 1 % IJ SOLN
5.0000 mL | Freq: Once | INTRAMUSCULAR | Status: AC
  Administered 2024-09-08: 5 mL via INTRADERMAL
  Filled 2024-09-08: qty 5

## 2024-09-08 MED ORDER — OXYCODONE HCL 5 MG PO TABS
5.0000 mg | ORAL_TABLET | ORAL | Status: DC | PRN
  Administered 2024-09-08 – 2024-09-09 (×3): 5 mg via ORAL
  Filled 2024-09-08 (×3): qty 1

## 2024-09-08 MED ORDER — PREDNISONE 5 MG PO TABS
5.0000 mg | ORAL_TABLET | Freq: Every day | ORAL | Status: DC
  Administered 2024-09-09: 5 mg via ORAL
  Filled 2024-09-08: qty 1

## 2024-09-08 MED ORDER — ENOXAPARIN SODIUM 30 MG/0.3ML IJ SOSY
30.0000 mg | PREFILLED_SYRINGE | Freq: Two times a day (BID) | INTRAMUSCULAR | Status: DC
  Administered 2024-09-09: 30 mg via SUBCUTANEOUS
  Filled 2024-09-08: qty 0.3

## 2024-09-08 MED ORDER — SODIUM CHLORIDE 0.9% FLUSH
3.0000 mL | Freq: Two times a day (BID) | INTRAVENOUS | Status: DC
  Administered 2024-09-08 – 2024-09-09 (×2): 3 mL via INTRAVENOUS

## 2024-09-08 MED ORDER — DOCUSATE SODIUM 100 MG PO CAPS
100.0000 mg | ORAL_CAPSULE | Freq: Two times a day (BID) | ORAL | Status: DC
  Filled 2024-09-08 (×2): qty 1

## 2024-09-08 MED ORDER — POLYETHYLENE GLYCOL 3350 17 G PO PACK
17.0000 g | PACK | Freq: Every day | ORAL | Status: DC | PRN
Start: 2024-09-08 — End: 2024-09-09

## 2024-09-08 MED ORDER — SODIUM CHLORIDE 0.9% FLUSH: 3.0000 mL | INTRAVENOUS | Status: DC | PRN

## 2024-09-08 MED ORDER — ZOLPIDEM TARTRATE 5 MG PO TABS
10.0000 mg | ORAL_TABLET | Freq: Every evening | ORAL | Status: DC | PRN
Start: 2024-09-08 — End: 2024-09-08

## 2024-09-08 MED ORDER — ROSUVASTATIN CALCIUM 5 MG PO TABS
10.0000 mg | ORAL_TABLET | Freq: Every day | ORAL | Status: DC
  Administered 2024-09-08 – 2024-09-09 (×2): 10 mg via ORAL
  Filled 2024-09-08 (×2): qty 2

## 2024-09-08 MED ORDER — SODIUM CHLORIDE 0.9 % IV SOLN: 250.0000 mL | INTRAVENOUS | Status: DC | PRN

## 2024-09-08 MED ORDER — TRAMADOL HCL 50 MG PO TABS
50.0000 mg | ORAL_TABLET | Freq: Four times a day (QID) | ORAL | Status: DC | PRN
  Administered 2024-09-09: 50 mg via ORAL
  Filled 2024-09-08: qty 1

## 2024-09-08 NOTE — Progress Notes (Signed)
 Patient ID: [name], female   DOB: 16-Feb-1956, 68 y.o.   MRN: [id number] Patient arrived to unit via stretcher. VSS. No c/o pain. Resting with call beel in reach and husband at bedside.  [name], RN

## 2024-09-08 NOTE — ED Provider Notes (Signed)
 [place] EMERGENCY DEPARTMENT AT [facility] Provider Note   CSN: [id number] Arrival date & time: 09/08/24  1224     Patient presents with: [name]   [name] is a 68 y.o. female.  68 year old female presents ED with complaints of mechanical fall prior to arrival.  Patient reports she fell down approximately 3 steps.  They just finished cleaning her steps and she was throwing a rug down the steps when she lost balance and fell down as she was throwing the rug.  She reports hitting the back of her head and then landing on her back.  Patient reports she has pain in her right shoulder blade and the back of her head.  She has some noticeable dried blood around her occipital region that is not visualized because of the c-collar.  No active bleeding noted.  Patient denies any shortness of breath visual changes headaches.  Patient did not lose consciousness and she is not on blood thinners.  Patient denies any dizziness or lightheadedness prior to the fall or after.     Prior to Admission medications   Medication Sig Start Date End Date Taking? Authorizing Provider  CALCIUM  CARBONATE-VITAMIN D PO Take 1 tablet by mouth daily.    [provider]  denosumab  (PROLIA ) 60 MG/ML SOSY injection Inject 60 mg into the skin every 6 (six) months.    [provider]  inFLIXimab in sodium chloride  0.9 % Inject into the vein every 30 (thirty) days.    [provider]  lisinopril  (PRINIVIL ,ZESTRIL ) 10 MG tablet Take 10 mg by mouth daily with breakfast.    [provider]  metoprolol  succinate (TOPROL  XL) 25 MG 24 hr tablet Take 1 tablet (25 mg total) by mouth daily. 04/08/24   Jeffrie Oneil BROCKS, MD  Multiple Vitamin (MULTIVITAMIN WITH MINERALS) TABS tablet Take 1 tablet by mouth daily.    [provider]  predniSONE  (DELTASONE ) 5 MG tablet Take 5 mg by mouth daily.    [provider]  rosuvastatin  (CRESTOR ) 10 MG tablet TAKE 1 TABLET BY MOUTH  EVERY DAY 02/27/23   Jeffrie Oneil BROCKS, MD  tiZANidine  (ZANAFLEX ) 4 MG tablet Take 4 mg by mouth every 8 (eight) hours as needed. 12/21/21   [provider]  traMADol  (ULTRAM ) 50 MG tablet Take 50 mg by mouth as needed. 12/23/21   [provider]  zolpidem  (AMBIEN ) 10 MG tablet Take 10 mg by mouth at bedtime as needed for sleep.    [provider]    Allergies: Arava  [leflunomide ], Clinoril [sulindac], Codeine, Other, and Plaquenil [hydroxychloroquine]    Review of Systems  Musculoskeletal:  Positive for arthralgias and myalgias.  All other systems reviewed and are negative.   Updated Vital Signs BP (!) 166/92 (BP Location: Right Arm)   Pulse 74   Temp 98.8 F (37.1 C) (Oral)   Resp 14   SpO2 97%   Physical Exam Vitals and nursing note reviewed.  Constitutional:      General: She is not in acute distress.    Appearance: Normal appearance. She is not ill-appearing.  HENT:     Head: Normocephalic.     Comments: Patient has noticeable dried blood in the posterior portion of her head.  There is a very small laceration approximately 1-1/2 cm on the posterior portion of her head.  No active bleeding.    Nose: Nose normal.  Eyes:     Extraocular Movements: Extraocular movements intact.     Conjunctiva/sclera:  Conjunctivae normal.     Pupils: Pupils are equal, round, and reactive to light.  Neck:     Comments: No pain to palpation down C-spine.  On initial evaluation patient was in c-collar which was difficult for physical exam but after clearance patient had good range of motion and no pain to palpation. Cardiovascular:     Rate and Rhythm: Normal rate.  Pulmonary:     Effort: Pulmonary effort is normal. No respiratory distress.     Breath sounds: Normal breath sounds.     Comments: Breath sounds normal throughout all fields to auscultation.  Patient does report some pain with deep inspiration. Abdominal:     General: Abdomen is flat. There is no distension.      Palpations: Abdomen is soft.     Tenderness: There is no abdominal tenderness. There is no guarding.     Comments: No pain to palpation throughout abdomen.  No distention or bruising noted.  Musculoskeletal:        General: Tenderness present. Normal range of motion.     Cervical back: Normal range of motion. No rigidity.     Comments: Patient has pain to palpation around the right scapula with no obvious dislocations of the right shoulder.  Patient has full range of motion of shoulders bilaterally.  No decreased range of motion in other extremities.  Patient does have noticeable arthritis in hands and feet causing deformities.  Patient is still able to ambulate due to these deformities and reports this is baseline for her.  Patient has no thoracic spine tenderness to palpation.  Skin:    General: Skin is warm.     Capillary Refill: Capillary refill takes less than 2 seconds.     Comments: No lacerations or bruising noted to extremities.  Neurological:     General: No focal deficit present.     Mental Status: She is alert.     Comments: No neurological deficits noted on exam.  Psychiatric:        Mood and Affect: Mood normal.        Behavior: Behavior normal.     (all labs ordered are listed, but only abnormal results are displayed) Labs Reviewed - No data to display  EKG: None  Radiology: No results found.   .Laceration Repair  Date/Time: 09/08/2024 4:05 PM  Performed by: Myriam Fonda RAMAN, PA-C Authorized by: Myriam Fonda RAMAN, PA-C      Medications Ordered in the ED  HYDROcodone -acetaminophen  (NORCO/VICODIN) 5-325 MG per tablet 2 tablet (has no administration in time range)    68 y.o. female presents to the ED with complaints of mechanical fall with injury to the back of her head and right shoulder.  The differential diagnosis includes but is not limited to fracture, dislocation, pneumothorax, or muscular injury/strain.  (Ddx)  On arrival pt is nontoxic, vitals  significant for mildly hypertensive.. Exam significant for dried blood noted on the posterior portion of her head with no obvious active bleeding.  There is some pain to palpation on the right scapular region no loss of range of motion of the right shoulder.  I ordered medication hydrocodone  for pain  Imaging Studies ordered:  I ordered imaging studies which included CT head C-spine and thoracic spine without contrast.  Shoulder and chest x-ray.  Imaging was significant for T5 fracture, transverse process, 5th and 6th rib fractures, small right sided apical pneumo.  ED Course:   Patient sitting comfortably in ED bed in no acute distress nontoxic-appearing.  Patient has full range of motion of upper extremities with no decrease sensation or strength.  Patient is reporting pain in the posterior portion of her head and around her right scapula.  Patient denies any shortness of breath or difficulty breathing.  Imaging was significant for fracture as noted above and small pneumothorax.  Patient was discussed with attending and it was advised to consult trauma surgery for recommendation.  Trauma surgery was consulted for the recommendation.  Michael Maczis advised they would consult patient and the ED for further recommendations.  Patient care transferred to PA-C Henderly at shift change.   Portions of this note were generated with Scientist, clinical (histocompatibility and immunogenetics). Dictation errors may occur despite best attempts at proofreading.   Final diagnoses:  None    ED Discharge Orders     None

## 2024-09-08 NOTE — H&P (Signed)
 [name] [birth year]  [id number].    Requesting MD: Lindsay Siva, PA-C Chief Complaint/Reason for Consult: Fall  HPI: [name] is a 68 y.o. female with a history of psoriatic arthritis, hypertension and hyperlipidemia who presented to the ED for a fall.  Patient reports she was trying to throw a rug down her steps when she fell.  She thinks she fell down 3 steps and hit the back of her head.  No loss of consciousness.  She did not ambulate after the event and was brought in by EMS.  She reports headache and posterior right rib pain.  She does have some shortness of breath when she takes deep breaths.  No photophobia, phonophobia, dizziness, visual changes, neck pain, back pain, abdominal pain or extremity pain.  She underwent workup and was found to have right 4-7th rib fractures, right pneumothorax, right T5 transverse process fracture and a scalp laceration.  Trauma surgery was asked to see.  Patient reports she lives at home with her husband and son.  She denies tobacco or alcohol use.  She is retired.  She is not on any blood thinners.  We reviewed her medication allergies.  ROS: ROS As above, see hpi  Family History  Problem Relation Age of Onset   Breast cancer Mother 68 - 3   Migraines Mother    Lupus Sister    Heart disease Sister     Past Medical History:  Diagnosis Date   Anxiety    Arthritis    Arthritis with psoriasis (HCC)    Bruises easily    Complication of anesthesia    pt states awaken during operating room during right knee surgery    Concussion    2015   Difficulty sleeping    Fall    2015   Family history of ischemic heart disease    HA (headache)    migraine history secondary to fall in 2015 no current issues   History of bronchitis    1990's    History of nonmelanoma skin cancer    History of urinary tract infection    Hypercholesterolemia    Hypertension    Insomnia    Low back pain    Osteoporosis    PONV (postoperative  nausea and vomiting)    Postmenopausal bleeding    Psoriasis    Right atrial enlargement    Squamous cell carcinoma    right ankle    Vaginal atrophy    Wears glasses     Past Surgical History:  Procedure Laterality Date   BACK SURGERY  2006   fusion C2-C6    CESAREAN SECTION     HAND SURGERY     multiple surgeries due to arthritis   TOTAL KNEE ARTHROPLASTY Right 11/03/2013   Procedure: RIGHT TOTAL KNEE ARTHROPLASTY;  Surgeon: Donnice JONETTA Car, MD;  Location: [facility];  Service: Orthopedics;  Laterality: Right;   TOTAL KNEE ARTHROPLASTY Left 11/01/2015   Procedure: LEFT TOTAL KNEE ARTHROPLASTY;  Surgeon: Donnice Car, MD;  Location: [facility];  Service: Orthopedics;  Laterality: Left;    Social History:  reports that she has never smoked. She has never used smokeless tobacco. She reports current alcohol use. She reports that she does not use drugs.  Allergies:  Allergies  Allergen Reactions   Arava  [Leflunomide ] Diarrhea   Clinoril [Sulindac]     JAUDICE    Codeine Nausea And Vomiting   Other     clineril caused hepatitis b  Plaquenil [Hydroxychloroquine] Diarrhea    (Not in a hospital admission)    Physical Exam: Blood pressure (!) 166/92, pulse 74, temperature 98.8 F (37.1 C), temperature source Oral, resp. rate 14, SpO2 97%. Gen:  Alert, NAD, pleasant HEENT: Posterior scalp lac, hemostatic - EDP to close. PEERL. EOMI. No obvious external nasal or ear lacerations or abrasions. No racoon eyes or battle sign. No CSF otorrhea. Dentition fair  Neck: No C-spine ttp or step offs. Trachea midline.  Card:  RRR. Radial and DP palpable b/l Pulm:  CTAB, no W/R/R, effort normal. On RA. R posterior chest wall ttp. No ttp over anterior chest wall  Abd: Soft, ND, NT Psych: A&Ox4 Skin: Warm and dry.  Neuro: Non-focal. MAE's. SILT to BUE and BLE. CN 3-12 grossly intact Msk:  RUE: Chronic arthritic changes noted. No gross deformities of joints or skin unless otherwise mentioned  above. Able passive/active range of motion of major joints of RUE without reported pain. No bony tenderness to palpation of the RUE.  LUE: Chronic arthritic changes noted. No gross deformities of joints or skin unless otherwise mentioned above. Able passive/active range of motion of major joints of LUE without reported pain. No bony tenderness to palpation of the LUE. RLE: Chronic arthritic changes noted. No gross deformities of joints or skin unless otherwise mentioned above. No sacral crepitus.  Able passive/active range of motion of major joints of RLE without reported pain. No bony tenderness to palpation of the RLE. LLE: Chronic arthritic changes noted. No gross deformities of joints or skin unless otherwise mentioned above. No sacral crepitus.  Able passive/active range of motion of major joints of RLE without reported pain. No bony tenderness to palpation of the LLE. Back: No midline thoracic or lumbar tenderness or step-offs.     No results found for this or any previous visit (from the past 48 hours). DG Chest 1 View Result Date: 09/08/2024 CLINICAL DATA:  Fall EXAM: CHEST  1 VIEW COMPARISON:  chest x-ray 10/28/2014 FINDINGS: There are acute minimally displaced bright fifth and sixth rib fractures. There is a tiny right apical pneumothorax. There is no mediastinal shift. There is no focal lung infiltrate or pleural effusion. The cardiomediastinal silhouette is within normal limits. IMPRESSION: 1. Acute minimally displaced right fifth and sixth rib fractures. 2. Tiny right apical pneumothorax. Electronically Signed   By: Greig Pique M.D.   On: 09/08/2024 15:21   CT Cervical Spine Wo Contrast Result Date: 09/08/2024 EXAM: CT CERVICAL SPINE WITHOUT CONTRAST 09/08/2024 01:48:50 PM TECHNIQUE: CT of the cervical spine was performed without the administration of intravenous contrast. Multiplanar reformatted images are provided for review. Automated exposure control, iterative reconstruction, and/or  weight based adjustment of the mA/kV was utilized to reduce the radiation dose to as low as reasonably achievable. COMPARISON: CT cervical spine 03/26/2006. CLINICAL HISTORY: Neck trauma (Age >= 65y). FINDINGS: CERVICAL SPINE: BONES AND ALIGNMENT: Chronic reversal of the normal cervical lordosis and grade 1 anterolisthesis of C4 on C5 and C5 on C6, similar to the remote prior CT. Chronic foreshortening of the dens is again noted and could reflect osteolysis secondary to an inflammatory arthritis or remote trauma. There is chronic anterior subluxation of C1 relative to C2 with C1-C2 ankylosis. Progressive bilateral atlanto-occipital arthropathy is noted, right worse than left. There is also interval osseous fusion across the C2-C3 posterior elements and partially across the disc space. Sequelae of C3-C7 posterior decompression and fusion are again noted with solid arthrodesis. There is also interbody osseous fusion  from C3-C7. No acute fracture is identified. DEGENERATIVE CHANGES: Progressive, severe disc degeneration at C7-T1 and T1-T2. Moderate right greater than left neural foraminal stenosis at C7-T1. Moderate to severe left greater than right neural foraminal stenosis at T1-T2 and T2-T3. No evidence of high grade cervical spinal canal stenosis. SOFT TISSUES: No prevertebral soft tissue swelling. IMPRESSION: 1. No acute cervical spine fracture or traumatic malalignment. 2. Extensive chronic degenerative and postoperative changes as above with chronic erosion or posttraumatic deformity of the dens. Electronically signed by: Dasie Hamburg MD 09/08/2024 03:10 PM EST RP Workstation: [id number]   DG Shoulder Right Result Date: 09/08/2024 CLINICAL DATA:  Fall and trauma to the right shoulder. EXAM: RIGHT SHOULDER - 2+ VIEW COMPARISON:  None Available. FINDINGS: Mildly displaced fractures of the posterolateral 5-7th ribs. No pneumothorax. No acute fracture or dislocation of the right shoulder. The soft tissues are  unremarkable. IMPRESSION: Mildly displaced fractures of the posterolateral 5-7th ribs. No pneumothorax. Electronically Signed   By: Vanetta Chou M.D.   On: 09/08/2024 14:45   CT Head Wo Contrast Result Date: 09/08/2024 EXAM: CT HEAD WITHOUT CONTRAST 09/08/2024 01:48:50 PM TECHNIQUE: CT of the head was performed without the administration of intravenous contrast. Automated exposure control, iterative reconstruction, and/or weight based adjustment of the mA/kV was utilized to reduce the radiation dose to as low as reasonably achievable. COMPARISON: CT head 08/05/2014. CLINICAL HISTORY: Head trauma, minor (Age >= 65y). FINDINGS: BRAIN AND VENTRICLES: No acute intracranial hemorrhage. There is mild scattered white matter hypodensities which are nonspecific but most commonly represent chronic microvascular ischemic changes. There are interval age-indeterminate though chronic appearing bilateral gangliocapsular lacunar infarctions. No evidence of acute infarct. No hydrocephalus. No extra-axial collection. No mass effect or midline shift. ORBITS: No acute abnormality. SINUSES: No acute abnormality. SOFT TISSUES AND SKULL: Left posterior scalp swelling and laceration. No calvarial fracture. Partially visualized upper cervical spine, see separately dictated report for same day CT cervical spine for further relevant evaluation. IMPRESSION: 1. No acute intracranial hemorrhage or calvarial fracture. 2. Left posterior scalp swelling and laceration. 3. Since 08/05/2014, interval age-indeterminate though chronic appearing bilateral gangliocapsular lacunar infarctions. Nonemergent MRI Brain would be useful to further assess. Electronically signed by: prentice spade 09/08/2024 02:31 PM EST RP Workstation: [id number]   CT Thoracic Spine Wo Contrast Result Date: 09/08/2024 CLINICAL DATA:  Neck and back trauma. EXAM: CT THORACIC SPINE WITHOUT CONTRAST TECHNIQUE: Multidetector CT images of the thoracic were obtained using  the standard protocol without intravenous contrast. RADIATION DOSE REDUCTION: This exam was performed according to the departmental dose-optimization program which includes automated exposure control, adjustment of the mA and/or kV according to patient size and/or use of iterative reconstruction technique. COMPARISON:  Thoracic spine radiographs 05/08/2016. Chest CTA 01/21/2024. FINDINGS: Alignment: Thoracolumbar scoliosis, convex to the left at T9-10 and to the right at L2. Mild degenerative anterolisthesis at T10-11. The alignment appears similar to the previous CT chest. Vertebrae: Suspected acute fracture of the right T5 transverse process (axial images 32 through 35 of series 4). Possible acute nondisplaced fractures of the right 4th and 5th ribs near the costovertebral junction. No evidence of acute vertebral body fracture or traumatic subluxation. Paraspinal and other soft tissues: No evidence of acute paraspinal hematoma. There is a small pneumothorax medially on the right. Mild dependent atelectasis in both lungs. No significant pleural effusion. Disc levels: Multilevel thoracic spondylosis with advanced disc space narrowing and posterior osteophytes contributing to multilevel spinal stenosis and foraminal narrowing. Posterior osteophytes are most prominent at T9-10  and contribute to spinal stenosis and right foraminal narrowing, similar to previous chest CT. Chronic central disc extrusion at T12-L1. IMPRESSION: 1. Suspected acute fracture of the right T5 transverse process. 2. Possible acute nondisplaced fractures of the right 4th and 5th ribs near the costovertebral junction. 3. No evidence of acute vertebral body fracture or traumatic subluxation. 4. Small right-sided pneumothorax. 5. Multilevel thoracic spondylosis with spinal stenosis and foraminal narrowing, similar to previous chest CT. 6. Critical Value/emergent results were called by telephone at the time of interpretation on 09/08/2024 at 2:17 pm  to provider [facility] , who verbally acknowledged these results. Electronically Signed   By: Elsie Perone M.D.   On: 09/08/2024 14:17    Anti-infectives (From admission, onward)    None       Assessment/Plan Fall down stairs 11/24 R 4-7th rib fractures - multimodal pain control, pulm toilet, PT/OT R PTX - Repeat CXR in AM R T5 TVP fx - multimodal pain control Posterior scalp laceration - EDP to close HTN - home meds HLD - home meds Psoriatic arthritis - home meds FEN - Reg VTE - SCDs, Lovenox  ID - None Foley - None Dispo - Admit to med-surg. AM CXR. PT/OT.   I reviewed nursing notes, last 24 h vitals and pain scores, last 48 h intake and output, last 24 h labs and trends, and last 24 h imaging results. Discussed with EDP.    Ozell CHRISTELLA Shaper, [facility] Surgery 09/08/2024, 4:02 PM Please see Amion for pager number during day hours 7:00am-4:30pm

## 2024-09-08 NOTE — TOC CAGE-AID Note (Signed)
 Transition of Care [facility]) - CAGE-AID Screening   Patient Details  Name: [name] MRN: [id number] Date of Birth: 12/12/1955  Transition of Care [facility] CM/SW Contact:    LEBRON ROCKIE ORN, RN Phone Number: [phone] 09/08/2024, 7:46 PM   Clinical Narrative: Pt currently in hospital after sustaining injuries to her ribs and TP fx as well as pneumothorax.  Pt denies alcohol or illicit drug use.  Screening complete.    CAGE-AID Screening:    Have You Ever Felt You Ought to Cut Down on Your Drinking or Drug Use?: No Have People Annoyed You By Critizing Your Drinking Or Drug Use?: No Have You Felt Bad Or Guilty About Your Drinking Or Drug Use?: No Have You Ever Had a Drink or Used Drugs First Thing In The Morning to Steady Your Nerves or to Get Rid of a Hangover?: No CAGE-AID Score: 0  Substance Abuse Education Offered: No

## 2024-09-08 NOTE — ED Triage Notes (Signed)
 Patient standing on stairs, bent over to try to fix a rug, and fell down four steps. No LOC or blood thinners. Bleeding controlled to wound on posterior head. Complains of mid back pain.

## 2024-09-09 ENCOUNTER — Other Ambulatory Visit (HOSPITAL_COMMUNITY): Payer: Self-pay

## 2024-09-09 ENCOUNTER — Observation Stay (HOSPITAL_COMMUNITY)

## 2024-09-09 DIAGNOSIS — S2241XA Multiple fractures of ribs, right side, initial encounter for closed fracture: Secondary | ICD-10-CM | POA: Diagnosis not present

## 2024-09-09 LAB — CBC
HCT: 38.6 % (ref 36.0–46.0)
Hemoglobin: 12.7 g/dL (ref 12.0–15.0)
MCH: 31 pg (ref 26.0–34.0)
MCHC: 32.9 g/dL (ref 30.0–36.0)
MCV: 94.1 fL (ref 80.0–100.0)
Platelets: 386 K/uL (ref 150–400)
RBC: 4.1 MIL/uL (ref 3.87–5.11)
RDW: 13.2 % (ref 11.5–15.5)
WBC: 13.9 K/uL — ABNORMAL HIGH (ref 4.0–10.5)
nRBC: 0 % (ref 0.0–0.2)

## 2024-09-09 LAB — BASIC METABOLIC PANEL WITH GFR
Anion gap: 12 (ref 5–15)
BUN: 24 mg/dL — ABNORMAL HIGH (ref 8–23)
CO2: 18 mmol/L — ABNORMAL LOW (ref 22–32)
Calcium: 8.8 mg/dL — ABNORMAL LOW (ref 8.9–10.3)
Chloride: 109 mmol/L (ref 98–111)
Creatinine, Ser: 0.91 mg/dL (ref 0.44–1.00)
GFR, Estimated: >60 mL/min (ref >60)
Glucose, Bld: 92 mg/dL (ref 70–99)
Potassium: 3.9 mmol/L (ref 3.5–5.1)
Sodium: 139 mmol/L (ref 135–145)

## 2024-09-09 MED ORDER — METHOCARBAMOL 500 MG PO TABS
500.0000 mg | ORAL_TABLET | Freq: Four times a day (QID) | ORAL | Status: DC
  Administered 2024-09-09 (×2): 500 mg via ORAL
  Filled 2024-09-09 (×2): qty 1

## 2024-09-09 MED ORDER — OXYCODONE HCL 5 MG PO TABS
5.0000 mg | ORAL_TABLET | Freq: Four times a day (QID) | ORAL | 0 refills | Status: AC | PRN
  Filled 2024-09-09: qty 20, 5d supply, fill #0

## 2024-09-09 MED ORDER — LIDOCAINE 5 % EX PTCH
1.0000 | MEDICATED_PATCH | CUTANEOUS | Status: DC
  Administered 2024-09-09: 1 via TRANSDERMAL
  Filled 2024-09-09: qty 1

## 2024-09-09 MED ORDER — HYDROMORPHONE HCL 1 MG/ML IJ SOLN: 0.5000 mg | INTRAMUSCULAR | Status: DC | PRN

## 2024-09-09 MED ORDER — OXYCODONE HCL 5 MG PO TABS
5.0000 mg | ORAL_TABLET | ORAL | Status: DC | PRN
  Administered 2024-09-09 (×2): 5 mg via ORAL
  Filled 2024-09-09 (×2): qty 1

## 2024-09-09 MED ORDER — DOCUSATE SODIUM 100 MG PO CAPS: 100.0000 mg | ORAL_CAPSULE | Freq: Two times a day (BID) | ORAL | Status: AC | PRN

## 2024-09-09 NOTE — Plan of Care (Signed)
   Problem: Education: Goal: Knowledge of General Education information will improve Description Including pain rating scale, medication(s)/side effects and non-pharmacologic comfort measures Outcome: Progressing

## 2024-09-09 NOTE — TOC Initial Note (Signed)
 Transition of Care [facility]) - Initial/Assessment Note    Patient Details  Name: [name] MRN: [id number] Date of Birth: [birth year]  Transition of Care [facility]) CM/SW Contact:    Josepha Mliss HERO, RN Phone Number: 09/09/2024, 12:10pm  Clinical Narrative:                 [name] is a 68 y.o. female who presented to [facility] ED 09/08/24 after a fall. Work-up found right 4-7th rib fractures, right pneumothorax, right T5 transverse process fracture, and a scalp laceration.  PTA, patient resides at home with spouse; she states that husband and son can provide needed assistance at home.  Patient with arthritis and has all needed DME at home.  She denies any needs presently.    PCP is Alberta Sharps   Expected Discharge Plan: Home/Self Care Barriers to Discharge: Continued Medical Work up              Expected Discharge Plan and Services   Discharge Planning Services: CM Consult   Living arrangements for the past 2 months: Single Family Home                                      Prior Living Arrangements/Services Living arrangements for the past 2 months: Single Family Home Lives with:: Spouse Patient language and need for interpreter reviewed:: Yes Do you feel safe going back to the place where you live?: Yes      Need for Family Participation in Patient Care: Yes (Comment) Care giver support system in place?: Yes (comment) Current home services: DME Criminal Activity/Legal Involvement Pertinent to Current Situation/Hospitalization: No - Comment as needed  Activities of Daily Living   ADL Screening (condition at time of admission) Independently performs ADLs?: Yes (appropriate for developmental age) Is the patient deaf or have difficulty hearing?: No Does the patient have difficulty seeing, even when wearing glasses/contacts?: No Does the patient have difficulty concentrating, remembering, or making decisions?: No                   Emotional  Assessment Appearance:: Appears stated age Attitude/Demeanor/Rapport: Engaged Affect (typically observed): Accepting Orientation: : Oriented to Self, Oriented to Place, Oriented to  Time, Oriented to Situation      Admission diagnosis:  Pneumothorax on right [J93.9] Traumatic pneumothorax, initial encounter [S27.0XXA] Fall, initial encounter [id number]] Closed fracture of multiple ribs of right side, initial encounter [S22.41XA] Closed fracture of fifth thoracic vertebra, unspecified fracture morphology, initial encounter (HCC) [S22.059A] Patient Active Problem List   Diagnosis Date Noted   Pneumothorax on right 09/08/2024   Nonspecific abnormal electrocardiogram (ECG) (EKG) 12/28/2021   Family history of early CAD 12/28/2021   S/P left TKA 11/01/2015   Cervical spondylosis with myelopathy 08/06/2014   Migraine 08/06/2014   HA (headache)    Arthropathy, lower leg 11/14/2013   Insomnia, unspecified 11/14/2013   Osteoporosis 11/12/2013   Hypertension 11/12/2013   Rheumatoid arthritis (HCC) 11/12/2013   Expected blood loss anemia 11/05/2013   S/P right TKA 11/03/2013   PCP:  Sharps Alberta, MD Pharmacy:   [facility] [phone] - [place], [place] - [address] [place] [address] Phone: [phone] Fax: [phone]  [id number] - [place], [place] - [address] [place] [address] Phone: [phone] Fax: [phone]  [id number] - [place], [place] - [address] [place] [address] [place] [place] [address] Phone: [phone] Fax: [phone]     Social Drivers of Health (SDOH) Social History: SDOH Screenings   Food Insecurity: No Food Insecurity (09/08/2024)  Housing: Low Risk  (09/08/2024)  Transportation Needs: No Transportation Needs (09/08/2024)  Utilities: Not At Risk (09/08/2024)  Social Connections: Socially Isolated (09/08/2024)   Tobacco Use: Low Risk  (10/23/2023)   SDOH Interventions:NA     Readmission Risk Interventions     No data to display         Mliss MICAEL Fass, RN, BSN  Trauma/Neuro ICU Case Manager [phone]

## 2024-09-09 NOTE — Progress Notes (Signed)
 Subjective: CC: Reports R posterior rib pain that she feels Oxy is helping with but the morphine  is not. No sob. Using IS. Pulling 1250. Stable back pain. Mobilizing to the bathroom. Tolerating diet without n/v. Passing flatus. No BM. Voiding.   Objective: Vital signs in last 24 hours: Temp:  [98.2 F (36.8 C)-99.2 F (37.3 C)] 98.4 F (36.9 C) (11/25 0733) Pulse Rate:  [58-98] 83 (11/25 0733) Resp:  [14-19] 16 (11/25 0733) BP: (102-166)/(73-104) 138/86 (11/25 0733) SpO2:  [94 %-98 %] 96 % (11/25 0733) Last BM Date : 09/08/24  Intake/Output from previous day: 11/24 0701 - 11/25 0700 In: 240 [P.O.:240] Out: -  Intake/Output this shift: No intake/output data recorded.  PE: Gen:  Alert, NAD, pleasant Card:  RRR Pulm:  CTAB, no W/R/R, effort normal. Pulling 1250 on IS Abd: Soft, ND, NT Ext:  No LE edema  Psych: A&Ox3   Lab Results:  Recent Labs    09/08/24 1902 09/09/24 0513  WBC 21.5* 13.9*  HGB 13.0 12.7  HCT 38.7 38.6  PLT 514* 386   BMET Recent Labs    09/08/24 1902 09/09/24 0513  NA 140 139  K 4.0 3.9  CL 108 109  CO2 17* 18*  GLUCOSE 151* 92  BUN 25* 24*  CREATININE 0.96 0.91  CALCIUM  9.4 8.8*   PT/INR No results for input(s): LABPROT, INR in the last 72 hours. CMP     Component Value Date/Time   NA 139 09/09/2024 0513   K 3.9 09/09/2024 0513   CL 109 09/09/2024 0513   CO2 18 (L) 09/09/2024 0513   GLUCOSE 92 09/09/2024 0513   BUN 24 (H) 09/09/2024 0513   CREATININE 0.91 09/09/2024 0513   CALCIUM  8.8 (L) 09/09/2024 0513   ALT 19 05/01/2022 1109   GFRNONAA >60 09/09/2024 0513   GFRAA >60 11/02/2015 0414   Lipase  No results found for: LIPASE  Studies/Results: DG CHEST PORT 1 VIEW Result Date: 09/09/2024 CLINICAL DATA:  Pneumothorax EXAM: PORTABLE CHEST 1 VIEW COMPARISON:  09/08/2024 FINDINGS: The cardio pericardial silhouette is enlarged. The tiny right apical pneumothorax seen previously is slightly more conspicuous today.  Basilar atelectasis noted bilaterally, potentially with tiny right pleural effusion. Posterior fractures of the right fifth and sixth ribs evident. IMPRESSION: 1. Tiny right apical pneumothorax is slightly more conspicuous today. 2. Posterior right fifth and sixth rib fractures. Electronically Signed   By: Camellia Candle M.D.   On: 09/09/2024 05:57   DG Chest 1 View Result Date: 09/08/2024 CLINICAL DATA:  Fall EXAM: CHEST  1 VIEW COMPARISON:  chest x-ray 10/28/2014 FINDINGS: There are acute minimally displaced bright fifth and sixth rib fractures. There is a tiny right apical pneumothorax. There is no mediastinal shift. There is no focal lung infiltrate or pleural effusion. The cardiomediastinal silhouette is within normal limits. IMPRESSION: 1. Acute minimally displaced right fifth and sixth rib fractures. 2. Tiny right apical pneumothorax. Electronically Signed   By: Greig Pique M.D.   On: 09/08/2024 15:21   CT Cervical Spine Wo Contrast Result Date: 09/08/2024 EXAM: CT CERVICAL SPINE WITHOUT CONTRAST 09/08/2024 01:48:50 PM TECHNIQUE: CT of the cervical spine was performed without the administration of intravenous contrast. Multiplanar reformatted images are provided for review. Automated exposure control, iterative reconstruction, and/or weight based adjustment of the mA/kV was utilized to reduce the radiation dose to as low as reasonably achievable. COMPARISON: CT cervical spine 03/26/2006. CLINICAL HISTORY: Neck trauma (Age >= 65y). FINDINGS: CERVICAL SPINE:  BONES AND ALIGNMENT: Chronic reversal of the normal cervical lordosis and grade 1 anterolisthesis of C4 on C5 and C5 on C6, similar to the remote prior CT. Chronic foreshortening of the dens is again noted and could reflect osteolysis secondary to an inflammatory arthritis or remote trauma. There is chronic anterior subluxation of C1 relative to C2 with C1-C2 ankylosis. Progressive bilateral atlanto-occipital arthropathy is noted, right worse than  left. There is also interval osseous fusion across the C2-C3 posterior elements and partially across the disc space. Sequelae of C3-C7 posterior decompression and fusion are again noted with solid arthrodesis. There is also interbody osseous fusion from C3-C7. No acute fracture is identified. DEGENERATIVE CHANGES: Progressive, severe disc degeneration at C7-T1 and T1-T2. Moderate right greater than left neural foraminal stenosis at C7-T1. Moderate to severe left greater than right neural foraminal stenosis at T1-T2 and T2-T3. No evidence of high grade cervical spinal canal stenosis. SOFT TISSUES: No prevertebral soft tissue swelling. IMPRESSION: 1. No acute cervical spine fracture or traumatic malalignment. 2. Extensive chronic degenerative and postoperative changes as above with chronic erosion or posttraumatic deformity of the dens. Electronically signed by: Dasie Hamburg MD 09/08/2024 03:10 PM EST RP Workstation: [id number]   DG Shoulder Right Result Date: 09/08/2024 CLINICAL DATA:  Fall and trauma to the right shoulder. EXAM: RIGHT SHOULDER - 2+ VIEW COMPARISON:  None Available. FINDINGS: Mildly displaced fractures of the posterolateral 5-7th ribs. No pneumothorax. No acute fracture or dislocation of the right shoulder. The soft tissues are unremarkable. IMPRESSION: Mildly displaced fractures of the posterolateral 5-7th ribs. No pneumothorax. Electronically Signed   By: Vanetta Chou M.D.   On: 09/08/2024 14:45   CT Head Wo Contrast Result Date: 09/08/2024 EXAM: CT HEAD WITHOUT CONTRAST 09/08/2024 01:48:50 PM TECHNIQUE: CT of the head was performed without the administration of intravenous contrast. Automated exposure control, iterative reconstruction, and/or weight based adjustment of the mA/kV was utilized to reduce the radiation dose to as low as reasonably achievable. COMPARISON: CT head 08/05/2014. CLINICAL HISTORY: Head trauma, minor (Age >= 65y). FINDINGS: BRAIN AND VENTRICLES: No acute  intracranial hemorrhage. There is mild scattered white matter hypodensities which are nonspecific but most commonly represent chronic microvascular ischemic changes. There are interval age-indeterminate though chronic appearing bilateral gangliocapsular lacunar infarctions. No evidence of acute infarct. No hydrocephalus. No extra-axial collection. No mass effect or midline shift. ORBITS: No acute abnormality. SINUSES: No acute abnormality. SOFT TISSUES AND SKULL: Left posterior scalp swelling and laceration. No calvarial fracture. Partially visualized upper cervical spine, see separately dictated report for same day CT cervical spine for further relevant evaluation. IMPRESSION: 1. No acute intracranial hemorrhage or calvarial fracture. 2. Left posterior scalp swelling and laceration. 3. Since 08/05/2014, interval age-indeterminate though chronic appearing bilateral gangliocapsular lacunar infarctions. Nonemergent MRI Brain would be useful to further assess. Electronically signed by: prentice spade 09/08/2024 02:31 PM EST RP Workstation: [id number]   CT Thoracic Spine Wo Contrast Result Date: 09/08/2024 CLINICAL DATA:  Neck and back trauma. EXAM: CT THORACIC SPINE WITHOUT CONTRAST TECHNIQUE: Multidetector CT images of the thoracic were obtained using the standard protocol without intravenous contrast. RADIATION DOSE REDUCTION: This exam was performed according to the departmental dose-optimization program which includes automated exposure control, adjustment of the mA and/or kV according to patient size and/or use of iterative reconstruction technique. COMPARISON:  Thoracic spine radiographs 05/08/2016. Chest CTA 01/21/2024. FINDINGS: Alignment: Thoracolumbar scoliosis, convex to the left at T9-10 and to the right at L2. Mild degenerative anterolisthesis at T10-11. The alignment appears  similar to the previous CT chest. Vertebrae: Suspected acute fracture of the right T5 transverse process (axial images 32  through 35 of series 4). Possible acute nondisplaced fractures of the right 4th and 5th ribs near the costovertebral junction. No evidence of acute vertebral body fracture or traumatic subluxation. Paraspinal and other soft tissues: No evidence of acute paraspinal hematoma. There is a small pneumothorax medially on the right. Mild dependent atelectasis in both lungs. No significant pleural effusion. Disc levels: Multilevel thoracic spondylosis with advanced disc space narrowing and posterior osteophytes contributing to multilevel spinal stenosis and foraminal narrowing. Posterior osteophytes are most prominent at T9-10 and contribute to spinal stenosis and right foraminal narrowing, similar to previous chest CT. Chronic central disc extrusion at T12-L1. IMPRESSION: 1. Suspected acute fracture of the right T5 transverse process. 2. Possible acute nondisplaced fractures of the right 4th and 5th ribs near the costovertebral junction. 3. No evidence of acute vertebral body fracture or traumatic subluxation. 4. Small right-sided pneumothorax. 5. Multilevel thoracic spondylosis with spinal stenosis and foraminal narrowing, similar to previous chest CT. 6. Critical Value/emergent results were called by telephone at the time of interpretation on 09/08/2024 at 2:17 pm to provider [facility] , who verbally acknowledged these results. Electronically Signed   By: Elsie Perone M.D.   On: 09/08/2024 14:17    Anti-infectives: Anti-infectives (From admission, onward)    None        Assessment/Plan Fall down stairs 11/24 R 4-7th rib fractures - multimodal pain control, pulm toilet, PT/OT R PTX - Repeat CXR this AM stable. No indication for chest tube or further CXR unless clinical change.  R T5 TVP fx - multimodal pain control Posterior scalp laceration - S/p closure with staples by EDP HTN - home meds HLD - home meds Psoriatic arthritis - home meds FEN - Reg VTE - SCDs, Lovenox  ID - None Foley -  None Dispo - Adjust pain medications. PT/OT. Possible d/c this afternoon. She reports she has assistance from her husband and son at discharge.   I reviewed nursing notes, last 24 h vitals and pain scores, last 48 h intake and output, last 24 h labs and trends, and last 24 h imaging results.    LOS: 0 days    Ozell CHRISTELLA Shaper, [facility] 09/09/2024, 8:58 AM Please see Amion for pager number during day hours 7:00am-4:30pm

## 2024-09-09 NOTE — Progress Notes (Signed)
 OT Cancellation Note  Patient Details Name: [name] MRN: [id number] DOB: 04-Aug-1956   Cancelled Treatment:    Reason Eval/Treat Not Completed: Other (comment) (Pt with a T5 TP fx; await neuro/ortho consult to clear her for activity with or without a brace. OT evaluation to follow up.)  [name] 09/09/2024, 7:35 AM

## 2024-09-09 NOTE — Progress Notes (Signed)
 PT Cancellation Note  Patient Details Name: [name] MRN: [id number] DOB: 07-09-1956   Cancelled Treatment:    Reason Eval/Treat Not Completed: Medical issues which prohibited therapy (PT consult appreciated and chart reviewed. Pt with a T5 TP fx; await neuro/ortho consult to clear her for activity with or without a brace. Will follow-up for PT evaluation as schedule permits.)  Randall SAUNDERS, PT, DPT Acute Rehabilitation Services Office: [phone] Secure Chat Preferred  Delon CHRISTELLA Callander 09/09/2024, 7:49 AM

## 2024-09-09 NOTE — Discharge Instructions (Signed)
PNEUMOTHORAX OR HEMOTHORAX +/- RIB FRACTURES  HOME INSTRUCTIONS   PAIN CONTROL:  Pain is best controlled by a usual combination of three different methods TOGETHER:  Ice/Heat Over the counter pain medication Prescription pain medication You may experience some swelling and bruising in area of broken ribs. Ice packs or heating pads (30-60 minutes up to 6 times a day) will help. Use ice for the first few days to help decrease swelling and bruising, then switch to heat to help relax tight/sore spots and speed recovery. Some people prefer to use ice alone, heat alone, alternating between ice & heat. Experiment to what works for you. Swelling and bruising can take several weeks to resolve.  It is helpful to take an over-the-counter pain medication regularly for the first few weeks. Choose one of the following that works best for you:  Naproxen (Aleve, etc) Two 220mg  tabs twice a day Ibuprofen (Advil, etc) Three 200mg  tabs four times a day (every meal & bedtime) Acetaminophen (Tylenol, etc) 500-650mg  four times a day (every meal & bedtime) A prescription for pain medication (such as oxycodone, hydrocodone, etc) may be given to you upon discharge. Take your pain medication as prescribed.  If you are having problems/concerns with the prescription medicine (does not control pain, nausea, vomiting, rash, itching, etc), please call us [phone] to see if we need to switch you to a different pain medicine that will work better for you and/or control your side effect better. If you need a refill on your pain medication, please contact your pharmacy. They will contact our office to request authorization. Prescriptions will not be filled after 5 pm or on week-ends. Avoid getting constipated. When taking pain medications, it is common to experience some constipation. Increasing fluid intake and taking a fiber supplement (such as Metamucil, Citrucel, FiberCon, MiraLax, etc) 1-2 times a day regularly will  usually help prevent this problem from occurring. A mild laxative (prune juice, Milk of Magnesia, MiraLax, etc) should be taken according to package directions if there are no bowel movements after 48 hours.  Watch out for diarrhea. If you have many loose bowel movements, simplify your diet to bland foods & liquids for a few days. Stop any stool softeners and decrease your fiber supplement. Switching to mild anti-diarrheal medications (Kayopectate, Pepto Bismol) can help. If this worsens or does not improve, please call us. Chest tube site wound: you may remove the dressing from your chest tube site 3 days after the removal of your chest tube. DO NOT shower over the dressing. Once   removed, you may shower as normal. Do not submerge your wound in water for 2-3 weeks.  FOLLOW UP  Please call our office to set up or confirm an appointment for follow up for 2 weeks after discharge. You will need to get a chest xray at either [facility] or [facility]. This will be outlined in your follow up instructions. Please call [facility] at [phone] if you have any questions about follow up.  If you have any orthopedic or other injuries you will need to follow up as outlined in your follow up instructions.   WHEN TO CALL us [phone]:  Poor pain control Reactions / problems with new medications (rash/itching, nausea, etc)  Fever over 101.5 F (38.5 C) Worsening swelling or bruising Redness, drainage, pain or swelling around chest tube site Worsening pain, productive cough, difficulty breathing or any other concerning symptoms  The clinic staff is available to answer your questions during  regular business hours (8:30am-5pm). Please don't hesitate to call and ask to speak to one of our nurses for clinical concerns.  If you have a medical emergency, go to the nearest emergency room or call 911.  A surgeon from [facility] is always on call at the [facility], [place], [place], [place],  [address] ?  MAIN: [phone] ? TOLL FREE: [phone] ?  FAX [phone]  [email or web address]      Information on Rib Fractures  A rib fracture is a break or crack in one of the bones of the ribs. The ribs are long, curved bones that wrap around your chest and attach to your spine and your breastbone. The ribs protect your heart, lungs, and other organs in the chest. A broken or cracked rib is often painful but is not usually serious. Most rib fractures heal on their own over time. However, rib fractures can be more serious if multiple ribs are broken or if broken ribs move out of place and push against other structures or organs. What are the causes? This condition is caused by: Repetitive movements with high force, such as pitching a baseball or having severe coughing spells. A direct blow to the chest, such as a sports injury, a car accident, or a fall. Cancer that has spread to the bones, which can weaken bones and cause them to break. What are the signs or symptoms? Symptoms of this condition include: Pain when you breathe in or cough. Pain when someone presses on the injured area. Feeling short of breath. How is this diagnosed? This condition is diagnosed with a physical exam and medical history. Imaging tests may also be done, such as: Chest X-ray. CT scan. MRI. Bone scan. Chest ultrasound. How is this treated? Treatment for this condition depends on the severity of the fracture. Most rib fractures usually heal on their own in 1-3 months. Sometimes healing takes longer if there is a cough that does not stop or if there are other activities that make the injury worse (aggravating factors). While you heal, you will be given medicines to control the pain. You will also be taught deep breathing exercises. Severe injuries may require hospitalization or surgery. Follow these instructions at home: Managing pain,  stiffness, and swelling If directed, apply ice to the injured area. Put ice in a plastic bag. Place a towel between your skin and the bag. Leave the ice on for 20 minutes, 2-3 times a day. Take over-the-counter and prescription medicines only as told by your health care provider. Activity Avoid a lot of activity and any activities or movements that cause pain. Be careful during activities and avoid bumping the injured rib. Slowly increase your activity as told by your health care provider. General instructions Do deep breathing exercises as told by your health care provider. This helps prevent pneumonia, which is a common complication of a broken rib. Your health care provider may instruct you to: Take deep breaths several times a day. Try to cough several times a day, holding a pillow against the injured area. Use a device called incentive spirometer to practice deep breathing several times a day. Drink enough fluid to keep your urine pale yellow. Do not wear a rib belt or binder. These restrict breathing, which can lead to pneumonia. Keep all follow-up visits as told by your health care provider. This is important. Contact a health care provider if: You have a fever. Get  help right away if: You have difficulty breathing or you are short of breath. You develop a cough that does not stop, or you cough up thick or bloody sputum. You have nausea, vomiting, or pain in your abdomen. Your pain gets worse and medicine does not help. Summary A rib fracture is a break or crack in one of the bones of the ribs. A broken or cracked rib is often painful but is not usually serious. Most rib fractures heal on their own over time. Treatment for this condition depends on the severity of the fracture. Avoid a lot of activity and any activities or movements that cause pain. This information is not intended to replace advice given to you by your health care provider. Make sure you discuss any questions  you have with your health care provider. Document Released: 10/02/2005 Document Revised: 01/01/2017 Document Reviewed: 01/01/2017 Elsevier Interactive Patient Education  2019 [employer]    Pneumothorax A pneumothorax is commonly called a collapsed lung. It is a condition in which air leaks from a lung and builds up between the thin layer of tissue that covers the lungs (visceral pleura) and the interior wall of the chest cavity (parietal pleura). The air gets trapped outside the lung, between the lung and the chest wall (pleural space). The air takes up space and prevents the lung from fully expanding. This condition sometimes occurs suddenly with no apparent cause. The buildup of air may be small or large. A small pneumothorax may go away on its own. A large pneumothorax will require treatment and hospitalization. What are the causes? This condition may be caused by: Trauma and injury to the chest wall. Surgery and other medical procedures. A complication of an underlying lung problem, especially chronic obstructive pulmonary disease (COPD) or emphysema. Sometimes the cause of this condition is not known. What increases the risk? You are more likely to develop this condition if: You have an underlying lung problem. You smoke. You are 85-36 years old, female, tall, and underweight. You have a personal or family history of pneumothorax. You have an eating disorder (anorexia nervosa). This condition can also happen quickly, even in people with no history of lung problems. What are the signs or symptoms? Sometimes a pneumothorax will have no symptoms. When symptoms are present, they can include: Chest pain. Shortness of breath. Increased rate of breathing. Bluish color to your lips or skin (cyanosis). How is this diagnosed? This condition may be diagnosed by: A medical history and physical exam. A chest X-ray, chest CT scan, or ultrasound. How is this treated? Treatment depends on  how severe your condition is. The goal of treatment is to remove the extra air and allow your lung to expand back to its normal size. For a small pneumothorax: No treatment may be needed. Extra oxygen is sometimes used to make it go away more quickly. For a large pneumothorax or a pneumothorax that is causing symptoms, a procedure is done to drain the air from your lungs. To do this, a health care provider may use: A needle with a syringe. This is used to suck air from a pleural space where no additional leakage is taking place. A chest tube. This is used to suck air where there is ongoing leakage into the pleural space. The chest tube may need to remain in place for several days until the air leak has healed. In more severe cases, surgery may be needed to repair the damage that is causing the leak. If you  have multiple pneumothorax episodes or have an air leak that will not heal, a procedure called a pleurodesis may be done. A medicine is placed in the pleural space to irritate the tissues around the lung so that the lung will stick to the chest wall, seal any leaks, and stop any buildup of air in that space. If you have an underlying lung problem, severe symptoms, or a large pneumothorax you will usually need to stay in the hospital. Follow these instructions at home: Lifestyle Do not use any products that contain nicotine or tobacco, such as cigarettes and e-cigarettes. These are major risk factors in pneumothorax. If you need help quitting, ask your health care provider. Do not lift anything that is heavier than 10 lb (4.5 kg), or the limit that your health care provider tells you, until he or she says that it is safe. Avoid activities that take a lot of effort (strenuous) for as long as told by your health care provider. Return to your normal activities as told by your health care provider. Ask your health care provider what activities are safe for you. Do not fly in an airplane or scuba dive  until your health care provider says it is okay. General instructions Take over-the-counter and prescription medicines only as told by your health care provider. If a cough or pain makes it difficult for you to sleep at night, try sleeping in a semi-upright position in a recliner or by using 2 or 3 pillows. If you had a chest tube and it was removed, ask your health care provider when you can remove the bandage (dressing). While the dressing is in place, do not allow it to get wet. Keep all follow-up visits as told by your health care provider. This is important. Contact a health care provider if: You cough up thick mucus (sputum) that is yellow or green in color. You were treated with a chest tube, and you have redness, increasing pain, or discharge at the site where it was placed. Get help right away if: You have increasing chest pain or shortness of breath. You have a cough that will not go away. You begin coughing up blood. You have pain that is getting worse or is not controlled with medicines. The site where your chest tube was located opens up. You feel air coming out of the site where the chest tube was placed. You have a fever or persistent symptoms for more than 2-3 days. You have a fever and your symptoms suddenly get worse. These symptoms may represent a serious problem that is an emergency. Do not wait to see if the symptoms will go away. Get medical help right away. Call your local emergency services (911 in the [place] Do not drive yourself to the hospital. Summary A pneumothorax, commonly called a collapsed lung, is a condition in which air leaks from a lung and gets trapped between the lung and the chest wall (pleural space). The buildup of air may be small or large. A small pneumothorax may go away on its own. A large pneumothorax will require treatment and hospitalization. Treatment for this condition depends on how severe the pneumothorax is. The goal of treatment is to  remove the extra air and allow the lung to expand back to its normal size. This information is not intended to replace advice given to you by your health care provider. Make sure you discuss any questions you have with your health care provider. Document Released: 10/02/2005 Document Revised: 09/10/2017 Document  Reviewed: 09/10/2017 Elsevier Interactive Patient Education  [employer].

## 2024-09-09 NOTE — Progress Notes (Signed)
 AVS reviewed with patient and husband. All questions answered. IV removed and pt has all belongings accounted for.

## 2024-09-09 NOTE — Care Management Obs Status (Signed)
 MEDICARE OBSERVATION STATUS NOTIFICATION   Patient Details  Name: [name] MRN: [id number] Date of Birth: July 29, 1956   Medicare Observation Status Notification Given:  Yes    [name] 09/09/2024, 12:21 PM

## 2024-09-09 NOTE — Evaluation (Signed)
 Occupational Therapy Evaluation Patient Details Name: [name] MRN: [id number] DOB: January 14, 1956 Today's Date: 09/09/2024   History of Present Illness   [name] is a 68 y.o. female who presented to [facility] ED 09/08/24 after a fall. Work-up found right 4-7th rib fractures, right pneumothorax, right T5 transverse process fracture, and a scalp laceration. PMHx: psoriatic arthritis, osteoporosis, HTN, and HLD.     Clinical Impressions [name] was evaluated s/p the above admission list. She is mod I and lives with family at baseline. Upon evaluation the pt was limited by pain, generalized weakness and decreased activity tolerance. Overall she demonstrated mod I ability to mobilize and complete Adls with RW. Reviewed IS routine and importance, pt verbalized understanding. Pt will benefit from continued acute OT services and no need for follow up OT.      If plan is discharge home, recommend the following:   Assist for transportation;Assistance with cooking/housework     Functional Status Assessment   Patient has had a recent decline in their functional status and demonstrates the ability to make significant improvements in function in a reasonable and predictable amount of time.     Equipment Recommendations   None recommended by OT      Precautions/Restrictions   Precautions Precautions: Fall Restrictions Weight Bearing Restrictions Per Provider Order: No     Mobility Bed Mobility Overal bed mobility: Modified Independent             General bed mobility comments: some increased pain with bed mobility    Transfers Overall transfer level: Modified independent Equipment used: Rolling walker (2 wheels)               General transfer comment: limited activity tolerance      Balance Overall balance assessment: Mild deficits observed, not formally tested                                         ADL either performed or assessed  with clinical judgement   ADL Overall ADL's : Modified independent             General ADL Comments: increased time due to pain, otherwise mod I.     Vision Baseline Vision/History: 1 Wears glasses Vision Assessment?: No apparent visual deficits     Perception Perception: Within Functional Limits       Praxis Praxis: WFL       Pertinent Vitals/Pain Pain Assessment Pain Assessment: Faces Faces Pain Scale: Hurts even more Pain Location: back Pain Descriptors / Indicators: Discomfort, Guarding Pain Intervention(s): Limited activity within patient's tolerance, Monitored during session     Extremity/Trunk Assessment Upper Extremity Assessment Upper Extremity Assessment: Generalized weakness (artritic deformaties)   Lower Extremity Assessment Lower Extremity Assessment: Defer to PT evaluation   Cervical / Trunk Assessment Cervical / Trunk Assessment: Other exceptions Cervical / Trunk Exceptions: 4/7 posterior rib fxs, T5 TP fx   Communication Communication Communication: No apparent difficulties   Cognition Arousal: Alert Behavior During Therapy: WFL for tasks assessed/performed Cognition: No apparent impairments                               Following commands: Intact       Cueing  General Comments   Cueing Techniques: Verbal cues  VSS, IS educated  Home Living Family/patient expects to be discharged to:: Private residence Living Arrangements: Spouse/significant other Available Help at Discharge: Family;Available 24 hours/day Type of Home: House Home Access: Stairs to enter [employer] of Steps: 4-5 Entrance Stairs-Rails: Right;Left Home Layout: Two level;Able to live on main level with bedroom/bathroom     Bathroom Shower/Tub: Producer, Television/film/video: Standard     Home Equipment: Agricultural Consultant (2 wheels);Cane - single point;BSC/3in1;Wheelchair - manual          Prior Functioning/Environment  Prior Level of Function : Independent/Modified Independent             Mobility Comments: intermittent use of SPC or RW ADLs Comments: mod I, drives, cooks    OT Problem List: Decreased strength;Decreased range of motion;Decreased activity tolerance;Impaired balance (sitting and/or standing);Pain   OT Treatment/Interventions: Self-care/ADL training;Therapeutic exercise;DME and/or AE instruction;Therapeutic activities;Balance training      OT Goals(Current goals can be found in the care plan section)   Acute Rehab OT Goals Patient Stated Goal: home OT Goal Formulation: With patient Time For Goal Achievement: 09/23/24 Potential to Achieve Goals: Good ADL Goals Additional ADL Goal #1: Pt will indep complete a shower transfer   OT Frequency:  Min 2X/week       AM-PAC OT 6 Clicks Daily Activity     Outcome Measure Help from another person eating meals?: None Help from another person taking care of personal grooming?: None Help from another person toileting, which includes using toliet, bedpan, or urinal?: None Help from another person bathing (including washing, rinsing, drying)?: None Help from another person to put on and taking off regular upper body clothing?: None Help from another person to put on and taking off regular lower body clothing?: None 6 Click Score: 24   End of Session Equipment Utilized During Treatment: Rolling walker (2 wheels) Nurse Communication: Mobility status  Activity Tolerance: Patient tolerated treatment well Patient left: in chair;with call bell/phone within reach  OT Visit Diagnosis: Unsteadiness on feet (R26.81);Muscle weakness (generalized) (M62.81);History of falling (Z91.81);Pain                Time: 8996-8971 OT Time Calculation (min): 25 min Charges:  OT General Charges $OT Visit: 1 Visit OT Evaluation $OT Eval Moderate Complexity: 1 Mod OT Treatments $Self Care/Home Management : 8-22 mins  Lucie Kendall, OTR/L Acute  Rehabilitation Services Office [phone] Secure Chat Communication Preferred   [name] 09/09/2024, 10:32 AM

## 2024-09-09 NOTE — Evaluation (Signed)
 Physical Therapy Evaluation Patient Details Name: [name] MRN: [id number] DOB: 04-29-1956 Today's Date: 09/09/2024  History of Present Illness  [name] is a 68 y.o. female who presented to [facility] ED 09/08/24 after a fall. Work-up found right 4-7th rib fractures, right pneumothorax, right T5 transverse process fracture, and a scalp laceration. PMHx: psoriatic arthritis, osteoporosis, HTN, and HLD.   Clinical Impression  Pt admitted with above diagnosis. PTA, pt was modI for functional mobility with intermittent use of DME (SPC vs. RW vs. w/c). She lives with her husband and son in a two story house with 4 STE. Pt is able to reside on the main floor. Pt currently with functional limitations due to the deficits listed below (see PT Problem List). She required supervision for sit<>stand using RW and CGA for gait using RW. Pt ambulated a household distance with a step-through gait pattern taking a standing rest break. She engaged in stair training in accordance with her home set-up. Pt ascended forwards and descended sideways with BUE supported at all times. Discussed how her family should be position to support her. Pt is currently limited by pain, generalized weakness, reduced balance, and decreased activity tolerance. Educated pt on splinting technique to help reduce right flank pain and reviewed incentive spirometer use and frequency. Pt will benefit from acute skilled PT to increase her independence and safety with mobility to allow discharge.     If plan is discharge home, recommend the following: A little help with walking and/or transfers;A little help with bathing/dressing/bathroom;Assistance with cooking/housework;Assist for transportation;Help with stairs or ramp for entrance   Can travel by private vehicle        Equipment Recommendations None recommended by PT (Pt already has neccessary DME)  Recommendations for Other Services       Functional Status Assessment Patient  has had a recent decline in their functional status and demonstrates the ability to make significant improvements in function in a reasonable and predictable amount of time.     Precautions / Restrictions Precautions Precautions: Fall Recall of Precautions/Restrictions: Intact Precaution/Restrictions Comments: Per Lindsay Shaper, PA-C, pt doesn't need anything from nsgy for the TVP fx. She is okay to mobilize. No back precautions Restrictions Weight Bearing Restrictions Per Provider Order: No      Mobility  Bed Mobility               General bed mobility comments: Not assessed. Pt greeted seated in recliner chair and returned there at end of session.    Transfers Overall transfer level: Needs assistance Equipment used: Rolling walker (2 wheels) Transfers: Sit to/from Stand Sit to Stand: Supervision           General transfer comment: Pt stood from recliner chair. Cued proper hand placement using RW. Powered up without physical assist. Good eccentric control.    Ambulation/Gait Ambulation/Gait assistance: Contact guard assist Gait Distance (Feet): 50 Feet (x2, standing rest break) Assistive device: Rolling walker (2 wheels) Gait Pattern/deviations: Step-through pattern, Decreased stride length Gait velocity: reduced Gait velocity interpretation: <1.8 ft/sec, indicate of risk for recurrent falls   General Gait Details: Pt ambulated with short slow steady steps. She maintained upright posture with good proximity to RW. Pt navigated room/hallway well, no LOB.  Stairs Stairs: Yes Stairs assistance: Contact guard assist Stair Management: One rail Right, Forwards, Step to pattern, Sideways Number of Stairs: 4 General stair comments: Pt ascended forwards leading with LLE. She had rail in RUE and HHA on LUE. Pt took one  step at a time. She descended sideways with BUE support on railing leading with RLE. Discussed how her family should be positioned to support  her.  Wheelchair Mobility     Tilt Bed    Modified Rankin (Stroke Patients Only)       Balance Overall balance assessment: Mild deficits observed, not formally tested                                           Pertinent Vitals/Pain Pain Assessment Pain Assessment: 0-10 Pain Score: 4  Pain Location: R ribs Pain Descriptors / Indicators: Discomfort, Guarding Pain Intervention(s): Monitored during session, Limited activity within patient's tolerance, Repositioned    Home Living Family/patient expects to be discharged to:: Private residence Living Arrangements: Spouse/significant other Available Help at Discharge: Family;Available 24 hours/day Type of Home: House Home Access: Stairs to enter Entrance Stairs-Rails: Right;Left;Can reach both Entrance Stairs-Number of Steps: 4   Home Layout: Two level;Able to live on main level with bedroom/bathroom Home Equipment: Rolling Walker (2 wheels);Cane - single point;BSC/3in1;Wheelchair - manual      Prior Function Prior Level of Function : Independent/Modified Independent             Mobility Comments: Intermittent use of SPC or RW or w/c depending on if it was a good day or bad day. 1 fall leading to admit. Pt was trying to throw a rug down her steps when she fell down 3 steps and hit the back of her head. ADLs Comments: mod I, drives, cooks     Extremity/Trunk Assessment   Upper Extremity Assessment Upper Extremity Assessment: Defer to OT evaluation    Lower Extremity Assessment Lower Extremity Assessment: Generalized weakness (Significant arthritic deformaties)    Cervical / Trunk Assessment Cervical / Trunk Assessment: Other exceptions Cervical / Trunk Exceptions: 4/7 posterior rib fxs, T5 TP fx  Communication   Communication Communication: No apparent difficulties    Cognition Arousal: Alert Behavior During Therapy: WFL for tasks assessed/performed   PT - Cognitive impairments: No  apparent impairments                       PT - Cognition Comments: Pt A,Ox4 Following commands: Intact       Cueing Cueing Techniques: Verbal cues     General Comments General comments (skin integrity, edema, etc.): VSS on RA. Discussed splinting technique using pillow/blanket support against R flank to help reduce pain during laughing, coughing, sneezing, and/or other pressure changes.    Exercises Other Exercises Other Exercises: Reviewed eucation on Incentive Spirometer use. Instructed her to complete 10 reps every hour.   Assessment/Plan    PT Assessment Patient needs continued PT services  PT Problem List Decreased strength;Decreased activity tolerance;Decreased balance;Decreased mobility;Decreased knowledge of use of DME;Decreased safety awareness       PT Treatment Interventions DME instruction;Gait training;Stair training;Functional mobility training;Therapeutic activities;Therapeutic exercise;Balance training;Patient/family education    PT Goals (Current goals can be found in the Care Plan section)  Acute Rehab PT Goals Patient Stated Goal: Return Home PT Goal Formulation: With patient Time For Goal Achievement: 09/23/24 Potential to Achieve Goals: Good    Frequency Min 2X/week     Co-evaluation               AM-PAC PT 6 Clicks Mobility  Outcome Measure Help needed turning from your back to your side  while in a flat bed without using bedrails?: A Little Help needed moving from lying on your back to sitting on the side of a flat bed without using bedrails?: A Little Help needed moving to and from a bed to a chair (including a wheelchair)?: A Little Help needed standing up from a chair using your arms (e.g., wheelchair or bedside chair)?: A Little Help needed to walk in hospital room?: A Little Help needed climbing 3-5 steps with a railing? : A Little 6 Click Score: 18    End of Session Equipment Utilized During Treatment: Gait belt Activity  Tolerance: Patient tolerated treatment well Patient left: in chair;with call bell/phone within reach Nurse Communication: Mobility status PT Visit Diagnosis: Muscle weakness (generalized) (M62.81);Unsteadiness on feet (R26.81)    Time: 8954-8897 PT Time Calculation (min) (ACUTE ONLY): 17 min   Charges:   PT Evaluation $PT Eval Moderate Complexity: 1 Mod PT Treatments $Gait Training: 8-22 mins PT General Charges $$ ACUTE PT VISIT: 1 Visit         Randall SAUNDERS, PT, DPT Acute Rehabilitation Services Office: [phone] Secure Chat Preferred  Delon CHRISTELLA Callander 09/09/2024, 1:06 PM

## 2024-09-25 NOTE — Discharge Summary (Signed)
 Patient ID: [name] [id number] [birth year] 68 y.o.  Admit date: 09/08/2024 Discharge date: 09/08/24  Admitting Diagnosis: Fall down stairs 11/24 R 4-7th rib fractures  R PTX  R T5 TVP fx  Posterior scalp laceration  HTN HLD  Psoriatic arthritis   Discharge Diagnosis Fall down stairs 11/24 R 4-7th rib fractures  R PTX  R T5 TVP fx  Posterior scalp laceration  HTN HLD  Psoriatic arthritis   Consultants None  HPI: [name] is a 68 y.o. female with a history of psoriatic arthritis, hypertension and hyperlipidemia who presented to the ED for a fall.  Patient reports she was trying to throw a rug down her steps when she fell.  She thinks she fell down 3 steps and hit the back of her head.  No loss of consciousness.  She did not ambulate after the event and was brought in by EMS.  She reports headache and posterior right rib pain.  She does have some shortness of breath when she takes deep breaths.  No photophobia, phonophobia, dizziness, visual changes, neck pain, back pain, abdominal pain or extremity pain.  She underwent workup and was found to have right 4-7th rib fractures, right pneumothorax, right T5 transverse process fracture and a scalp laceration.  Trauma surgery was asked to see.   Patient reports she lives at home with her husband and son.  She denies tobacco or alcohol use.  She is retired.  She is not on any blood thinners.  We reviewed her medication allergies.  Procedures Fonda Siva - 09/08/24 Laceration Repair  Hospital Course:  Patient presented as above after Fall down stairs on 11/24  R 4-7th rib fractures - tx with multimodal pain control, pulm toilet, PT/OT  R PTX - Repeat CXR stable. No indication for chest tube.   R T5 TVP fx - multimodal pain control  Posterior scalp laceration - S/p closure with staples by EDP. Will have office arrange follow up for removal.   Patient worked with therapies during admission.  TOC consulted  to assist with arranging therapy recommendations for DME and follow-up.  On 11/24 patient was felt stable for discharge home.  Patient reports she does have a pain contract.  I did call her provider and let her know she is going home with a prescription of oxycodone  with the plan to pause her at home Ultram  at this time. Follow up as noted below.   Allergies as of 09/09/2024       Reactions   Arava  [leflunomide ] Diarrhea   Clinoril [sulindac]    JAUDICE   Other    clineril caused hepatitis b    Plaquenil [hydroxychloroquine] Diarrhea        Medication List     PAUSE taking these medications    traMADol  50 MG tablet Wait to take this until your doctor or other care provider tells you to start again. Commonly known as: ULTRAM  Take 50 mg by mouth as needed for moderate pain (pain score 4-6) or severe pain (pain score 7-10).       TAKE these medications    acetaminophen  500 MG tablet Commonly known as: TYLENOL  Take 1,000 mg by mouth every 6 (six) hours as needed for mild pain (pain score 1-3) or moderate pain (pain score 4-6).   Adbry 300 MG/2ML auto injector Generic drug: tralokinumab-ldrm Inject 300 mg into the skin See admin instructions. Inject 300mg  subcutaneously every other week starting on day 15   Bimzelx 320  MG/2ML pen Generic drug: bimekizumab-bkzx Inject 320 mg into the skin See admin instructions. Inject 320mg  Subcutaneous every 2 months.   CALCIUM  CARBONATE-VITAMIN D PO Take 1 tablet by mouth daily.   denosumab  60 MG/ML Sosy injection Commonly known as: PROLIA  Inject 60 mg into the skin every 6 (six) months.   docusate sodium  100 MG capsule Commonly known as: COLACE Take 1 capsule (100 mg total) by mouth 2 (two) times daily as needed for mild constipation.   hydrOXYzine 10 MG tablet Commonly known as: ATARAX Take 10-30 mg by mouth as needed for itching or anxiety.   inFLIXimab in sodium chloride  0.9 % Inject into the vein every 30 (thirty) days.    lisinopril  10 MG tablet Commonly known as: ZESTRIL  Take 10 mg by mouth daily with breakfast.   metoprolol  succinate 25 MG 24 hr tablet Commonly known as: Toprol  XL Take 1 tablet (25 mg total) by mouth daily.   oxyCODONE  5 MG immediate release tablet Commonly known as: Oxy IR/ROXICODONE  Take 1-2 tablets (5-10 mg total) by mouth every 6 (six) hours as needed for severe pain (pain score 7-10) or moderate pain (pain score 4-6) (5mg  for moderate pain, 10mg  for severe pain).   predniSONE  5 MG tablet Commonly known as: DELTASONE  Take 5 mg by mouth daily.   rosuvastatin  10 MG tablet Commonly known as: CRESTOR  TAKE 1 TABLET BY MOUTH EVERY DAY   tiZANidine  4 MG tablet Commonly known as: ZANAFLEX  Take 4 mg by mouth every 8 (eight) hours as needed.   zolpidem  10 MG tablet Commonly known as: AMBIEN  Take 10 mg by mouth at bedtime.          Follow-up Information     Claudene Pellet, MD Follow up.   Specialty: Family Medicine Why: For follow up Contact information: [address] [place] [address] [phone]         [facility] [place] Follow up.   Specialty: General Surgery Contact information: [address] [place] [address] [phone]                 Signed: Ozell CHRISTELLA Shaper, [facility] 09/25/2024, 4:00 PM Please see Amion for pager number during day hours 7:00am-4:30pm

## 2024-10-02 ENCOUNTER — Other Ambulatory Visit: Payer: Self-pay | Admitting: Cardiology

## 2024-10-14 ENCOUNTER — Other Ambulatory Visit: Payer: Self-pay | Admitting: *Deleted

## 2024-10-14 DIAGNOSIS — I351 Nonrheumatic aortic (valve) insufficiency: Secondary | ICD-10-CM

## 2024-10-14 DIAGNOSIS — Z01812 Encounter for preprocedural laboratory examination: Secondary | ICD-10-CM

## 2024-10-14 DIAGNOSIS — I7789 Other specified disorders of arteries and arterioles: Secondary | ICD-10-CM

## 2025-01-19 ENCOUNTER — Ambulatory Visit: Admitting: Cardiology

## 2025-08-26 ENCOUNTER — Other Ambulatory Visit (HOSPITAL_COMMUNITY)
